# Patient Record
Sex: Female | Born: 1985 | Race: White | Hispanic: No | Marital: Married | Smoking: Never smoker
Health system: Southern US, Community
[De-identification: ages and names within clinical notes are randomized; demographics above are authoritative.]

## PROBLEM LIST (undated history)

## (undated) DIAGNOSIS — IMO0002 Reserved for concepts with insufficient information to code with codable children: Secondary | ICD-10-CM

## (undated) DIAGNOSIS — R12 Heartburn: Secondary | ICD-10-CM

## (undated) DIAGNOSIS — K819 Cholecystitis, unspecified: Secondary | ICD-10-CM

## (undated) DIAGNOSIS — O1490 Unspecified pre-eclampsia, unspecified trimester: Secondary | ICD-10-CM

## (undated) DIAGNOSIS — F419 Anxiety disorder, unspecified: Secondary | ICD-10-CM

## (undated) DIAGNOSIS — O26899 Other specified pregnancy related conditions, unspecified trimester: Secondary | ICD-10-CM

## (undated) HISTORY — PX: OTHER SURGICAL HISTORY: SHX169

## (undated) HISTORY — PX: FRACTURE SURGERY: SHX138

## (undated) HISTORY — PX: TENDON REPAIR: SHX5111

## (undated) HISTORY — DX: Unspecified pre-eclampsia, unspecified trimester: O14.90

## (undated) HISTORY — DX: Reserved for concepts with insufficient information to code with codable children: IMO0002

---

## 2006-01-22 ENCOUNTER — Other Ambulatory Visit: Admission: RE | Admit: 2006-01-22 | Discharge: 2006-01-22 | Payer: Self-pay | Admitting: Family Medicine

## 2007-03-05 DIAGNOSIS — IMO0002 Reserved for concepts with insufficient information to code with codable children: Secondary | ICD-10-CM

## 2007-03-05 HISTORY — DX: Reserved for concepts with insufficient information to code with codable children: IMO0002

## 2007-03-31 ENCOUNTER — Other Ambulatory Visit: Admission: RE | Admit: 2007-03-31 | Discharge: 2007-03-31 | Payer: Self-pay | Admitting: Family Medicine

## 2007-08-07 ENCOUNTER — Other Ambulatory Visit: Admission: RE | Admit: 2007-08-07 | Discharge: 2007-08-07 | Payer: Self-pay | Admitting: Obstetrics and Gynecology

## 2008-05-12 ENCOUNTER — Other Ambulatory Visit: Admission: RE | Admit: 2008-05-12 | Discharge: 2008-05-12 | Payer: Self-pay | Admitting: Obstetrics and Gynecology

## 2008-10-02 ENCOUNTER — Inpatient Hospital Stay (HOSPITAL_COMMUNITY): Admission: AC | Admit: 2008-10-02 | Discharge: 2008-10-05 | Payer: Self-pay

## 2008-10-04 ENCOUNTER — Ambulatory Visit: Payer: Self-pay | Admitting: Physical Medicine & Rehabilitation

## 2008-10-13 ENCOUNTER — Ambulatory Visit: Payer: Self-pay | Admitting: Physical Medicine & Rehabilitation

## 2008-11-09 ENCOUNTER — Ambulatory Visit: Payer: Self-pay | Admitting: Psychology

## 2009-01-30 ENCOUNTER — Encounter: Admission: RE | Admit: 2009-01-30 | Discharge: 2009-02-28 | Payer: Self-pay | Admitting: Orthopedic Surgery

## 2010-06-09 LAB — POCT I-STAT, CHEM 8
Calcium, Ion: 1.01 mmol/L — ABNORMAL LOW (ref 1.12–1.32)
Glucose, Bld: 107 mg/dL — ABNORMAL HIGH (ref 70–99)
HCT: 26 % — ABNORMAL LOW (ref 36.0–46.0)
Hemoglobin: 8.8 g/dL — ABNORMAL LOW (ref 12.0–15.0)
TCO2: 16 mmol/L (ref 0–100)

## 2010-06-09 LAB — CBC
HCT: 33.6 % — ABNORMAL LOW (ref 36.0–46.0)
Hemoglobin: 11.6 g/dL — ABNORMAL LOW (ref 12.0–15.0)
Hemoglobin: 9.3 g/dL — ABNORMAL LOW (ref 12.0–15.0)
MCHC: 34.5 g/dL (ref 30.0–36.0)
MCHC: 34.5 g/dL (ref 30.0–36.0)
MCV: 90.7 fL (ref 78.0–100.0)
MCV: 90.8 fL (ref 78.0–100.0)
Platelets: 218 10*3/uL (ref 150–400)
RBC: 2.8 MIL/uL — ABNORMAL LOW (ref 3.87–5.11)
RBC: 2.94 MIL/uL — ABNORMAL LOW (ref 3.87–5.11)
RBC: 2.98 MIL/uL — ABNORMAL LOW (ref 3.87–5.11)
RDW: 13.7 % (ref 11.5–15.5)
RDW: 13.8 % (ref 11.5–15.5)
RDW: 14.2 % (ref 11.5–15.5)
WBC: 7.6 10*3/uL (ref 4.0–10.5)

## 2010-06-09 LAB — TYPE AND SCREEN: ABO/RH(D): A POS

## 2010-06-09 LAB — POCT PREGNANCY, URINE: Preg Test, Ur: NEGATIVE

## 2010-06-09 LAB — ABO/RH: ABO/RH(D): A POS

## 2010-06-09 LAB — BASIC METABOLIC PANEL
CO2: 20 mEq/L (ref 19–32)
Chloride: 110 mEq/L (ref 96–112)
GFR calc non Af Amer: >60 mL/min (ref >60)
Glucose, Bld: 172 mg/dL — ABNORMAL HIGH (ref 70–99)
Potassium: 4.1 mEq/L (ref 3.5–5.1)
Sodium: 136 mEq/L (ref 135–145)

## 2010-06-09 LAB — POCT I-STAT 4, (NA,K, GLUC, HGB,HCT): HCT: 31 % — ABNORMAL LOW (ref 36.0–46.0)
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
## 2010-07-17 NOTE — Op Note (Signed)
NAME:  Amber Nguyen, NUON NO.:  [id number]   MEDICAL RECORD NO.:  [id number]          PATIENT TYPE:  INP   LOCATION:  3311                         FACILITY:  [facility]   PHYSICIAN:  Artist Pais. Weingold, M.D.DATE OF BIRTH:  November 03, 1985   DATE OF PROCEDURE:  10/02/2008  DATE OF DISCHARGE:                               OPERATIVE REPORT   PREOPERATIVE DIAGNOSIS:  Deep laceration dorsal aspect right hand with  significant forearm abrasions.   POSTOPERATIVE DIAGNOSIS:  Deep laceration dorsal aspect right hand with  significant forearm abrasions.   PROCEDURE:  Incision and drainage of the above, exploration repair of  extensor digiti quinti and extensor digitorum communis x2 right hand.   SURGEON:  Artist Pais. Mina Marble, MD   ASSISTANT:  None.   ANESTHESIA:  General.   TOURNIQUET TIME:  28 minutes.   COMPLICATIONS:  None.   DRAINS:  None.   OPERATIVE REPORT:  The patient was taken to the operating suite.  After  induction of adequate general anesthesia, the right upper extremity was  prepped draped in sterile fashion.  An Esmarch was used to exsanguinate  the limb and the tourniquet was inflated to 250 mmHg.  At this point in  time, large amount of abrasions over the ulnar border of the forearm  were irrigated and debrided of nonviable material.  The wound over the  ulnar border of the fifth digit just proximal to the metacarpophalangeal  joint and extended proximally, distally and longitudinally.  Dissection  was carried down to the extensor mechanism.  There was complete  laceration of the EDQ you and two fifths of the EDC to the fifth digit.  This wound was thoroughly irrigated, debrided of nonviable tissue and  then repairs were undertaken x3 using 3-0 Ethibond in horizontal  mattress sutures x2 for each tendon except for one small flip of the EDC  which had one horizontal mattress suture.  The wound was then again  irrigated, rough edges were debrided and was  loosely closed with 4-0  Vicryl Rapide suture.  Xeroform, 4x4s fluffs and a compressive dressing  and an ulnar gutter splint was applied as well as a large piece of  Xeroform for the proximal forearm abrasions.  The patient tolerated the  procedures well and went to recovery room in stable fashion.      Artist Pais Mina Marble, M.D.  Electronically Signed    MAW/MEDQ  D:  10/02/2008  T:  10/03/2008  Job:  [id number]
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
## 2010-08-07 ENCOUNTER — Emergency Department (HOSPITAL_BASED_OUTPATIENT_CLINIC_OR_DEPARTMENT_OTHER)
Admission: EM | Admit: 2010-08-07 | Discharge: 2010-08-07 | Disposition: A | Discharge status: Home / Self Care | Payer: BC Managed Care – PPO | Attending: Emergency Medicine | Admitting: Emergency Medicine

## 2010-08-07 DIAGNOSIS — H9209 Otalgia, unspecified ear: Secondary | ICD-10-CM | POA: Insufficient documentation

## 2012-07-09 ENCOUNTER — Encounter: Payer: Self-pay | Admitting: *Deleted

## 2012-07-10 ENCOUNTER — Ambulatory Visit (INDEPENDENT_AMBULATORY_CARE_PROVIDER_SITE_OTHER): Payer: BC Managed Care – PPO | Admitting: Certified Nurse Midwife

## 2012-07-10 ENCOUNTER — Encounter: Payer: Self-pay | Admitting: Certified Nurse Midwife

## 2012-07-10 VITALS — BP 110/72 | Ht 63.5 in | Wt 181.0 lb

## 2012-07-10 DIAGNOSIS — Z309 Encounter for contraceptive management, unspecified: Secondary | ICD-10-CM

## 2012-07-10 DIAGNOSIS — Z01419 Encounter for gynecological examination (general) (routine) without abnormal findings: Secondary | ICD-10-CM

## 2012-07-10 DIAGNOSIS — Z0000 Encounter for general adult medical examination without abnormal findings: Secondary | ICD-10-CM

## 2012-07-10 DIAGNOSIS — IMO0001 Reserved for inherently not codable concepts without codable children: Secondary | ICD-10-CM

## 2012-07-10 LAB — POCT URINALYSIS DIPSTICK
Bilirubin, UA: NEGATIVE
Ketones, UA: NEGATIVE
Leukocytes, UA: NEGATIVE
Nitrite, UA: NEGATIVE

## 2012-07-10 MED ORDER — ETONOGESTREL-ETHINYL ESTRADIOL 0.12-0.015 MG/24HR VA RING: VAGINAL_RING | VAGINAL | Status: DC

## 2012-07-10 NOTE — Patient Instructions (Addendum)
General topics  Next pap or exam is  due in 1 year Take a Women's multivitamin Take 1200 mg. of calcium daily - prefer dietary If any concerns in interim to call back  Breast Self-Awareness Practicing breast self-awareness may pick up problems early, prevent significant medical complications, and possibly save your life. By practicing breast self-awareness, you can become familiar with how your breasts look and feel and if your breasts are changing. This allows you to notice changes early. It can also offer you some reassurance that your breast health is good. One way to learn what is normal for your breasts and whether your breasts are changing is to do a breast self-exam. If you find a lump or something that was not present in the past, it is best to contact your caregiver right away. Other findings that should be evaluated by your caregiver include nipple discharge, especially if it is bloody; skin changes or reddening; areas where the skin seems to be pulled in (retracted); or new lumps and bumps. Breast pain is seldom associated with cancer (malignancy), but should also be evaluated by a caregiver. BREAST SELF-EXAM The best time to examine your breasts is 5 7 days after your menstrual period is over.  ExitCare Patient Information 2013 [employer].   Exercise to Stay Healthy Exercise helps you become and stay healthy. EXERCISE IDEAS AND TIPS Choose exercises that:  You enjoy.  Fit into your day. You do not need to exercise really hard to be healthy. You can do exercises at a slow or medium level and stay healthy. You can:  Stretch before and after working out.  Try yoga, Pilates, or tai chi.  Lift weights.  Walk fast, swim, jog, run, climb stairs, bicycle, dance, or rollerskate.  Take aerobic classes. Exercises that burn about 150 calories:  Running 1  miles in 15 minutes.  Playing volleyball for 45 to 60 minutes.  Washing and waxing a car for 45 to 60  minutes.  Playing touch football for 45 minutes.  Walking 1  miles in 35 minutes.  Pushing a stroller 1  miles in 30 minutes.  Playing basketball for 30 minutes.  Raking leaves for 30 minutes.  Bicycling 5 miles in 30 minutes.  Walking 2 miles in 30 minutes.  Dancing for 30 minutes.  Shoveling snow for 15 minutes.  Swimming laps for 20 minutes.  Walking up stairs for 15 minutes.  Bicycling 4 miles in 15 minutes.  Gardening for 30 to 45 minutes.  Jumping rope for 15 minutes.  Washing windows or floors for 45 to 60 minutes. Document Released: 03/23/2010 Document Revised: 05/13/2011 Document Reviewed: 03/23/2010 ExitCare Patient Information 2013 [employer].   Other topics ( that may be useful information):    Sexually Transmitted Disease Sexually transmitted disease (STD) refers to any infection that is passed from person to person during sexual activity. This may happen by way of saliva, semen, blood, vaginal mucus, or urine. Common STDs include:  Gonorrhea.  Chlamydia.  Syphilis.  HIV/AIDS.  Genital herpes.  Hepatitis B and C.  Trichomonas.  Human papillomavirus (HPV).  Pubic lice. CAUSES  An STD may be spread by bacteria, virus, or parasite. A person can get an STD by:  Sexual intercourse with an infected person.  Sharing sex toys with an infected person.  Sharing needles with an infected person.  Having intimate contact with the genitals, mouth, or rectal areas of an infected person. SYMPTOMS  Some people may not have any symptoms, but   they can still pass the infection to others. Different STDs have different symptoms. Symptoms include:  Painful or bloody urination.  Pain in the pelvis, abdomen, vagina, anus, throat, or eyes.  Skin rash, itching, irritation, growths, or sores (lesions). These usually occur in the genital or anal area.  Abnormal vaginal discharge.  Penile discharge in men.  Soft, flesh-colored skin growths in the  genital or anal area.  Fever.  Pain or bleeding during sexual intercourse.  Swollen glands in the groin area.  Yellow skin and eyes (jaundice). This is seen with hepatitis. DIAGNOSIS  To make a diagnosis, your caregiver may:  Take a medical history.  Perform a physical exam.  Take a specimen (culture) to be examined.  Examine a sample of discharge under a microscope.  Perform blood test TREATMENT   Chlamydia, gonorrhea, trichomonas, and syphilis can be cured with antibiotic medicine.  Genital herpes, hepatitis, and HIV can be treated, but not cured, with prescribed medicines. The medicines will lessen the symptoms.  Genital warts from HPV can be treated with medicine or by freezing, burning (electrocautery), or surgery. Warts may come back.  HPV is a virus and cannot be cured with medicine or surgery.However, abnormal areas may be followed very closely by your caregiver and may be removed from the cervix, vagina, or vulva through office procedures or surgery. If your diagnosis is confirmed, your recent sexual partners need treatment. This is true even if they are symptom-free or have a negative culture or evaluation. They should not have sex until their caregiver says it is okay. HOME CARE INSTRUCTIONS  All sexual partners should be informed, tested, and treated for all STDs.  Take your antibiotics as directed. Finish them even if you start to feel better.  Only take over-the-counter or prescription medicines for pain, discomfort, or fever as directed by your caregiver.  Rest.  Eat a balanced diet and drink enough fluids to keep your urine clear or pale yellow.  Do not have sex until treatment is completed and you have followed up with your caregiver. STDs should be checked after treatment.  Keep all follow-up appointments, Pap tests, and blood tests as directed by your caregiver.  Only use latex condoms and water-soluble lubricants during sexual activity. Do not use  petroleum jelly or oils.  Avoid alcohol and illegal drugs.  Get vaccinated for HPV and hepatitis. If you have not received these vaccines in the past, talk to your caregiver about whether one or both might be right for you.  Avoid risky sex practices that can break the skin. The only way to avoid getting an STD is to avoid all sexual activity.Latex condoms and dental dams (for oral sex) will help lessen the risk of getting an STD, but will not completely eliminate the risk. SEEK MEDICAL CARE IF:   You have a fever.  You have any new or worsening symptoms. Document Released: 05/11/2002 Document Revised: 05/13/2011 Document Reviewed: 05/18/2010 ExitCare Patient Information 2013 [employer].    Domestic Abuse You are being battered or abused if someone close to you hits, pushes, or physically hurts you in any way. You also are being abused if you are forced into activities. You are being sexually abused if you are forced to have sexual contact of any kind. You are being emotionally abused if you are made to feel worthless or if you are constantly threatened. It is important to remember that help is available. No one has the right to abuse you. PREVENTION OF FURTHER   ABUSE  Learn the warning signs of danger. This varies with situations but may include: the use of alcohol, threats, isolation from friends and family, or forced sexual contact. Leave if you feel that violence is going to occur.  If you are attacked or beaten, report it to the police so the abuse is documented. You do not have to press charges. The police can protect you while you or the attackers are leaving. Get the officer's name and badge number and a copy of the report.  Find someone you can trust and tell them what is happening to you: your caregiver, a nurse, clergy member, close friend or family member. Feeling ashamed is natural, but remember that you have done nothing wrong. No one deserves abuse. Document Released:  02/16/2000 Document Revised: 05/13/2011 Document Reviewed: 04/26/2010 ExitCare Patient Information 2013 [employer].    How Much is Too Much Alcohol? Drinking too much alcohol can cause injury, accidents, and health problems. These types of problems can include:   Car crashes.  Falls.  Family fighting (domestic violence).  Drowning.  Fights.  Injuries.  Burns.  Damage to certain organs.  Having a baby with birth defects. ONE DRINK CAN BE TOO MUCH WHEN YOU ARE:  Working.  Pregnant or breastfeeding.  Taking medicines. Ask your doctor.  Driving or planning to drive. If you or someone you know has a drinking problem, get help from a doctor.  Document Released: 12/15/2008 Document Revised: 05/13/2011 Document Reviewed: 12/15/2008 ExitCare Patient Information 2013 [employer].   Smoking Hazards Smoking cigarettes is extremely bad for your health. Tobacco smoke has over 200 known poisons in it. There are over 60 chemicals in tobacco smoke that cause cancer. Some of the chemicals found in cigarette smoke include:   Cyanide.  Benzene.  Formaldehyde.  Methanol (wood alcohol).  Acetylene (fuel used in welding torches).  Ammonia. Cigarette smoke also contains the poisonous gases nitrogen oxide and carbon monoxide.  Cigarette smokers have an increased risk of many serious medical problems and Smoking causes approximately:  90% of all lung cancer deaths in men.  80% of all lung cancer deaths in women.  90% of deaths from chronic obstructive lung disease. Compared with nonsmokers, smoking increases the risk of:  Coronary heart disease by 2 to 4 times.  Stroke by 2 to 4 times.  Men developing lung cancer by 23 times.  Women developing lung cancer by 13 times.  Dying from chronic obstructive lung diseases by 12 times.  . Smoking is the most preventable cause of death and disease in our society.  WHY IS SMOKING ADDICTIVE?  Nicotine is the chemical  agent in tobacco that is capable of causing addiction or dependence.  When you smoke and inhale, nicotine is absorbed rapidly into the bloodstream through your lungs. Nicotine absorbed through the lungs is capable of creating a powerful addiction. Both inhaled and non-inhaled nicotine may be addictive.  Addiction studies of cigarettes and spit tobacco show that addiction to nicotine occurs mainly during the teen years, when young people begin using tobacco products. WHAT ARE THE BENEFITS OF QUITTING?  There are many health benefits to quitting smoking.   Likelihood of developing cancer and heart disease decreases. Health improvements are seen almost immediately.  Blood pressure, pulse rate, and breathing patterns start returning to normal soon after quitting. QUITTING SMOKING   American Lung Association - 1-800-LUNGUSA  American Cancer Society - 1-800-ACS-2345 Document Released: 03/28/2004 Document Revised: 05/13/2011 Document Reviewed: 11/30/2008 ExitCare Patient Information 2013 [employer].   Stress Management Stress is a state of physical or mental tension that often results from changes in your life or normal routine. Some common causes of stress are:  Death of a loved one.  Injuries or severe illnesses.  Getting fired or changing jobs.  Moving into a new home. Other causes may be:  Sexual problems.  Business or financial losses.  Taking on a large debt.  Regular conflict with someone at home or at work.  Constant tiredness from lack of sleep. It is not just bad things that are stressful. It may be stressful to:  Win the lottery.  Get married.  Buy a new car. The amount of stress that can be easily tolerated varies from person to person. Changes generally cause stress, regardless of the types of change. Too much stress can affect your health. It may lead to physical or emotional problems. Too little stress (boredom) may also become stressful. SUGGESTIONS TO  REDUCE STRESS:  Talk things over with your family and friends. It often is helpful to share your concerns and worries. If you feel your problem is serious, you may want to get help from a professional counselor.  Consider your problems one at a time instead of lumping them all together. Trying to take care of everything at once may seem impossible. List all the things you need to do and then start with the most important one. Set a goal to accomplish 2 or 3 things each day. If you expect to do too many in a single day you will naturally fail, causing you to feel even more stressed.  Do not use alcohol or drugs to relieve stress. Although you may feel better for a short time, they do not remove the problems that caused the stress. They can also be habit forming.  Exercise regularly - at least 3 times per week. Physical exercise can help to relieve that "uptight" feeling and will relax you.  The shortest distance between despair and hope is often a good night's sleep.  Go to bed and get up on time allowing yourself time for appointments without being rushed.  Take a short "time-out" period from any stressful situation that occurs during the day. Close your eyes and take some deep breaths. Starting with the muscles in your face, tense them, hold it for a few seconds, then relax. Repeat this with the muscles in your neck, shoulders, hand, stomach, back and legs.  Take good care of yourself. Eat a balanced diet and get plenty of rest.  Schedule time for having fun. Take a break from your daily routine to relax. HOME CARE INSTRUCTIONS   Call if you feel overwhelmed by your problems and feel you can no longer manage them on your own.  Return immediately if you feel like hurting yourself or someone else. Document Released: 08/14/2000 Document Revised: 05/13/2011 Document Reviewed: 04/06/2007 ExitCare Patient Information 2013 [employer].   

## 2012-07-10 NOTE — Progress Notes (Signed)
27 y.o. G0P0000 Married Caucasian Fe here for annual exam. Periods normal, no issues.  Nuvaring working well. Recently married!! No health issues today.   Patient's last menstrual period was 06/24/2012.          Sexually active: yes  The current method of family planning is NuvaRing vaginal inserts.    Exercising: yes  walking,cardio Smoker:  no  Health Maintenance: Pap:  07-04-11 neg MMG:  none Colonoscopy:  none BMD:   2002 TDaP:  2013 Labs: poct urine-neg, hgb-12.3   reports that she has never smoked. She does not have any smokeless tobacco history on file. She reports that she drinks about 0.5 ounces of alcohol per week. She reports that she does not use illicit drugs.  Past Medical History  Diagnosis Date  . 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 Tubes in ears      Current Outpatient Prescriptions  Medication Sig Dispense Refill  . etonogestrel-ethinyl estradiol (NUVARING) 0.12-0.015 MG/24HR vaginal ring Place 1 each vaginally every 28 (twenty-eight) days. Insert vaginally and leave in place for 3 consecutive weeks, then remove for 1 week.      . Multiple Vitamin (MULTIVITAMIN) capsule Take 1 capsule by mouth daily.       No current facility-administered medications for this visit.    Family History  Problem Relation Age of Onset  . Thyroid disease Mother   . Hypertension Father   . Diabetes Paternal Grandmother   . Diabetes Paternal Grandfather   . Cancer Maternal Grandmother     lung    ROS:  Pertinent items are noted in HPI.  Otherwise, a comprehensive ROS was negative.  Exam:   BP 110/72  Ht 5' 3.5" (1.613 m)  Wt 181 lb (82.101 kg)  BMI 31.56 kg/m2  LMP 06/24/2012 Height: 5' 3.5" (161.3 cm)  Ht Readings from Last 3 Encounters:  07/10/12 5' 3.5" (1.613 m)    General appearance: alert, cooperative and appears stated age Head: Normocephalic, without obvious abnormality, atraumatic Neck: no adenopathy, supple,  symmetrical, trachea midline and thyroid normal to inspection and palpation Lungs: clear to auscultation bilaterally Breasts: normal appearance, no masses or tenderness, No nipple discharge or bleeding, No axillary or supraclavicular adenopathy, Taught monthly breast self examination Heart: regular rate and rhythm Abdomen: soft, non-tender; no masses,  no organomegaly Extremities: extremities normal, atraumatic, no cyanosis or edema Skin: Skin color, texture, turgor normal. No rashes or lesions Lymph nodes: Cervical, supraclavicular, and axillary nodes normal. No abnormal inguinal nodes palpated Neurologic: Grossly normal   Pelvic: External genitalia:  no lesions              Urethra:  normal appearing urethra with no masses, tenderness or lesions              Bartholin's and Skene's: normal                 Vagina: normal appearing vagina with normal color and discharge, no lesions              Cervix: normal non tender              Pap taken: no Bimanual Exam:  Uterus:  normal size, contour, position, consistency, mobility, non-tender and anteflexed              Adnexa: normal adnexa and no mass, fullness, tenderness  Rectovaginal: Confirms               Anus:  normal sphincter tone, no lesions  A:  Well Woman with normal exam  Contraception Nuvaring  P:   Reviewed health and wellness pertinent to exam  Pap smear as per guidelines No pap smear today counseled on adequate intake of calcium and vitamin D, diet and exercise  Rx Nuvaring see order return annually or prn  An After Visit Summary was printed and given to the patient.  Reviewed, TL

## 2012-07-13 LAB — HEMOGLOBIN, FINGERSTICK: Hemoglobin, fingerstick: 12.3 g/dL (ref 12.0–16.0)

## 2012-12-21 ENCOUNTER — Telehealth: Payer: Self-pay | Admitting: Certified Nurse Midwife

## 2012-12-21 NOTE — Telephone Encounter (Signed)
Spoke with pt about appt. Pt needs early am or late pm appt as she works in [place]. Sched OV tomorrow at 4 pm with DL.

## 2012-12-21 NOTE — Telephone Encounter (Signed)
Patient wants to come in and talk with debbie about preconceptional counciling. Couldn't get her in until november 24 and she doesn't want to wait that long.

## 2012-12-22 ENCOUNTER — Ambulatory Visit (INDEPENDENT_AMBULATORY_CARE_PROVIDER_SITE_OTHER): Payer: BC Managed Care – PPO | Admitting: Certified Nurse Midwife

## 2012-12-22 ENCOUNTER — Encounter: Payer: Self-pay | Admitting: Certified Nurse Midwife

## 2012-12-22 VITALS — BP 110/80 | HR 64 | Resp 16 | Ht 63.5 in | Wt 187.0 lb

## 2012-12-22 DIAGNOSIS — Z3009 Encounter for other general counseling and advice on contraception: Secondary | ICD-10-CM

## 2012-12-22 NOTE — Progress Notes (Signed)
27 y.o. Married Caucasian female G0P0000 here for preconceptional counseling. Patient would like information also on genetic counseling, due to sister child having Auto Recessive polycystic kidney disease. Patient denies any history of genetic problems for her or spouse. Patient is currently being monitored for one occurrence elevated glucose level with PCP, no medications. Currently on Nuvaring for contraception. Both patient and spouse has regular aex without problems with lab work. Patient on balanced diet for weight loss. Patient walks 3 miles daily and stretches. No other health issues. Financially ready for pregnancy, no environmental hazards with work or home.  Immunizations up to date. Has not taken flu vaccine. Paient and spouse non smokers, no alcohol or drug use. Patient taking multivitamin only.  O: Healthy WD,WN female Affect: Normal  A:Preconceptual consultation, planning pregnancy in the beginning of 2015. 2-Rubella status 3-Family history(sister child) RPKD   P: Discussed importance of nutrition and normal weight for height prior to pregnancy to reduce risk of excessive weight gain and PIH. Patient currently working on weight loss, encouraged to continue. Discussed possible genetic phone consultation with [facility] to see status if screening needed. Patient will call and advise if referral needed, if decides to do genetic assessment. Given handout on preparation before conception, folic acid sources and ovulation chart. Instructed to stop OCP 2 months prior to trying for conception, can use condoms for contraception. Lab: Rubella immune status. Questions addressed at length. Will advise when planning to start trying.     RV prn

## 2012-12-23 NOTE — Progress Notes (Signed)
Note reviewed, agree with plan.  Duncan Alejandro, MD  

## 2013-02-23 ENCOUNTER — Telehealth: Payer: Self-pay | Admitting: Certified Nurse Midwife

## 2013-02-23 NOTE — Telephone Encounter (Signed)
Patient has a positive at home pregnancy test wants to come in to take on here.

## 2013-02-23 NOTE — Telephone Encounter (Signed)
Spoke with patient. States LMP 01/21/13. Positive home pregnancy test at home. Appointment scheduled. Husband will come with her so scheduled for 12/31.  Routing to provider for final review. Patient agreeable to disposition. Will close encounter

## 2013-03-03 ENCOUNTER — Ambulatory Visit (INDEPENDENT_AMBULATORY_CARE_PROVIDER_SITE_OTHER): Payer: BC Managed Care – PPO | Admitting: Certified Nurse Midwife

## 2013-03-03 ENCOUNTER — Encounter: Payer: Self-pay | Admitting: Certified Nurse Midwife

## 2013-03-03 VITALS — BP 110/70 | HR 64 | Resp 16 | Ht 63.5 in | Wt 191.0 lb

## 2013-03-03 DIAGNOSIS — Z3201 Encounter for pregnancy test, result positive: Secondary | ICD-10-CM

## 2013-03-03 DIAGNOSIS — N912 Amenorrhea, unspecified: Secondary | ICD-10-CM

## 2013-03-03 LAB — POCT URINE PREGNANCY: Preg Test, Ur: POSITIVE

## 2013-03-03 NOTE — Progress Notes (Signed)
Encounter reviewed by Dr. Brook Silva.  

## 2013-03-03 NOTE — Progress Notes (Signed)
  27 y.o.Married Caucasian female go po presents with no menses since11/20/14 and positive UPT at home and confirmed here today. Periods were regular, no problems with. Patient had stopped Nuvaring to anticipate pregnancy. Patient currently taking prenatal vitamins daily and no other medications. Denies alcohol, tobacco or drug use.. Patient denies andy bleeding or cramping. Complaining of slight nausea on occasion and breast tenderness. Spouse with patient. Patient is excited about pregnancy. Eating 3 meals a day with protein shake in am. Fluid intake mainly water, with some juice, occasional soda. Walks for exercise. Denies environmental hazards at work or at home. No other health issues. Patient sees PCP for aex and labs.  O: Healthy WDWN female Affect:Normal, orientation x 3 Rubella status immune on 12/22/12 AEX 07/10/12 normal  POCT UPT positive   Assessment:  Amenorrhea with positive UPT Family History of RPKD(sister child)  Plan: Discussed with patient importance of prenatal care, given list of current OB providers in area. Discussed birth options and questions addressed. Discussed nutritional needs with handout given. Discussed importance of adequate fluid intake and rest to avoid fatigue. Comfort measures for nausea given. Reviewed warning signs of early pregnancy and need to advise. Patient will call and schedule OB appointment and advise to practice so records can be sent. Questions addressed regarding care. Wished well with pregnancy.   RV prn    42 minutes spent with patient with >50% of time spent in face to face counseling.

## 2013-03-10 LAB — OB RESULTS CONSOLE RUBELLA ANTIBODY, IGM: Rubella: IMMUNE

## 2013-03-10 LAB — OB RESULTS CONSOLE ABO/RH: RH Type: POSITIVE

## 2013-03-10 LAB — OB RESULTS CONSOLE HIV ANTIBODY (ROUTINE TESTING): HIV: NONREACTIVE

## 2013-03-10 LAB — OB RESULTS CONSOLE RPR: RPR: NONREACTIVE

## 2013-03-10 LAB — OB RESULTS CONSOLE GC/CHLAMYDIA
CHLAMYDIA, DNA PROBE: NEGATIVE
Gonorrhea: NEGATIVE

## 2013-03-10 LAB — OB RESULTS CONSOLE ANTIBODY SCREEN: Antibody Screen: NEGATIVE

## 2013-03-10 LAB — OB RESULTS CONSOLE HEPATITIS B SURFACE ANTIGEN: HEP B S AG: NEGATIVE

## 2013-04-07 ENCOUNTER — Encounter (HOSPITAL_COMMUNITY): Payer: BC Managed Care – PPO

## 2013-04-09 ENCOUNTER — Ambulatory Visit (HOSPITAL_COMMUNITY)
Admission: RE | Admit: 2013-04-09 | Discharge: 2013-04-09 | Disposition: A | Discharge status: Home / Self Care | Payer: BC Managed Care – PPO | Source: Ambulatory Visit | Attending: Obstetrics and Gynecology | Admitting: Obstetrics and Gynecology

## 2013-04-09 DIAGNOSIS — O352XX0 Maternal care for (suspected) hereditary disease in fetus, not applicable or unspecified: Secondary | ICD-10-CM

## 2013-04-09 NOTE — Progress Notes (Signed)
Genetic Counseling  High-Risk Gestation Note  Appointment Date:  04/09/2013 Referred By: Amber Ou, MD Date of Birth:  [birth year] Partner:  Amber Nguyen   Pregnancy History: G1P0000 Estimated Date of Delivery: 11/23/13 Estimated Gestational Age: 15w1dAttending: MRenella Cunas MD  I met with Mrs. Amber Bodoand her husband, [name] for genetic counseling regarding family history concerns.  Amber Nguyen reported that her nephew has a diagnosis of autosomal recessive polycystic kidney disease (ARPKD).  By report, the pregnancy history was remarkable for oligohydramnios.  Amber Nguyen attended several of her sister's doctor's appointments and reported that her sister's anatomy ultrasound was wnl.  At birth, her nephew had abnormal urine output and labs.  His kidneys were enlarged by physical exam and a renal ultrasound confirmed this suspicion.  [facility]  By report, [facility]  He was given a clinical diagnosis of ARPKD.  Amber Nguyen reported that genetic testing was performed, but the results were "inconclusive."  Based on further questioning, it appears that the child had one detectable gene alteration, but not a second.  Parental testing has not been performed at this time.  Medical records were not available for review.  Amber Nguyen contacted her sister during our appointment.  Her sister agreed to send me her son's genetics records, including his genetic test results for confirmation of the reported history.    This couple was counseled that ARPKD is a genetic condition, which occurs in ~1 in every 10,000 to 40,000 individuals.  The condition is characterized by both renal and liver disease, but other organ systems can be variably affected.  The majority of individuals with ARPKD classically present during the fetal or neonatal period with enlarged echogenic kidneys.  Renal disease is characterized by  nephromegaly, hypertension, and renal dysfunction.  Greater than 50% of individuals with ARPKD have end stage renal disease (ESRD) within the first decade of life.  ESRD often requires kidney transplantation.  We discussed that fetal onset of the condition is associated with kidney dysfunction in utero, which causes oligohydramnios and pulmonary hypoplasia.  These infants have significant risk for early death secondary to respiratory distress.  In addition, we discussed that ARPKD causes hepatobiliary disease, which is characterized by hepatomegaly, splenomegaly, and progressive portal hypertension.  We discussed that ARPKD shows significant variability in the age of onset and the presenting clinical features.  We spent time reviewing the autosomal recessive inheritance of ARPKD.  We discussed that PKHD1 is the only gene known to be associated with ARPKD.  They were counseled that parents of a child with ARPKD are typically carriers of the condition.  A carrier refers to an individual who has one altered gene and one typical functioning copy of the gene.  Carriers of ARPKD are healthy and have no features of ARPKD.  Considering the reported family history, Ms. TMckimmyhas a 1 in 2 chance of being a carrier of ARPKD.  Given that Amber Nguyen's sister has not had genetic testing, and it is uncertain if her nephew has 1 or 2 detectable PKHD1 alterations, genetic testing for Amber Nguyen would not be fully informative at this time.  We reviewed that the general population carrier frequency of ARPKD is ~1 in 749  We discussed that given the family history, the population frequency of ARPKD, and the recessive inheritance, the risk for the fetus to have ARPKD is ~1 in 529  We discussed the availability of prenatal diagnosis for ARPKD.  They understand that once familial gene mutations are known, prenatal diagnosis can be performed via CVS or amniocentesis.  Given that ARPKD can present during fetal development, we also  discussed the availability of a detailed ultrasound at 18+ weeks gestation to assess fetal kidney development and function.  A repeat ultrasound at ~28+ weeks should be considered as well.  This couple expressed interest in ultrasound.  Amber Nguyen was scheduled to return at [redacted] weeks gestation for a detailed anatomy ultrasound.  Amber Nguyen may elect to have genetic testing to determine her carrier status for ARPKD, if her sister has genetic testing and is found to have a detectable PKHD1 alteration.    This couple was counseled that there are a variety of genetic conditions with cystic renal disease, including autosomal dominant PKD (ADPKD).  We discussed that ADPKD typically has adult onset; however, 1-2% of patients present during the neonatal period, often with signs and symptoms indistinguishable from ARPKD.  For this reason, we discussed that the risk assessment and options discussed today depend on the accuracy of the reported history.  Further genetic counseling is warranted if additional information is discovered.  The remainder of the family histories were reviewed and found to be contributory for Amber Nguyen having a brother who had a nephrectomy at age 10.  Amber Nguyen is not sure why his brother had his kidney removed.  We discussed the various causes for a nephrectomy.  There is no other family history of renal disease.  Without further information an accurate risk assessment cannot be provided.  We discussed the option of Amber Nguyen having renal imaging to assess his own kidneys.    Amber Nguyen was provided with written information regarding cystic fibrosis (CF) including the carrier frequency and incidence in the Caucasian population, the availability of carrier testing and prenatal diagnosis if indicated.  In addition, we discussed that CF is routinely screened for as part of the [place] newborn screening panel.  She declined testing today.   Amber Nguyen denied exposure to environmental toxins or  chemical agents. She denied the use of alcohol, tobacco or street drugs. She denied significant viral illnesses during the course of her pregnancy.   I counseled this couple regarding the above risks and available options.  The approximate face-to-face time with the genetic counselor was 53 minutes.  Sharyne Richters, MS Certified Genetic Counselor

## 2013-06-04 ENCOUNTER — Other Ambulatory Visit: Payer: Self-pay | Admitting: Obstetrics and Gynecology

## 2013-06-04 DIAGNOSIS — Z8279 Family history of other congenital malformations, deformations and chromosomal abnormalities: Secondary | ICD-10-CM

## 2013-06-04 DIAGNOSIS — Z3689 Encounter for other specified antenatal screening: Secondary | ICD-10-CM

## 2013-06-09 ENCOUNTER — Encounter (HOSPITAL_COMMUNITY): Payer: Self-pay

## 2013-06-09 ENCOUNTER — Ambulatory Visit (HOSPITAL_COMMUNITY)
Admission: RE | Admit: 2013-06-09 | Discharge: 2013-06-09 | Disposition: A | Discharge status: Home / Self Care | Payer: BC Managed Care – PPO | Source: Ambulatory Visit | Attending: Obstetrics and Gynecology | Admitting: Obstetrics and Gynecology

## 2013-06-09 VITALS — BP 126/82 | HR 112 | Wt 192.0 lb

## 2013-06-09 DIAGNOSIS — Z1389 Encounter for screening for other disorder: Secondary | ICD-10-CM | POA: Insufficient documentation

## 2013-06-09 DIAGNOSIS — Z363 Encounter for antenatal screening for malformations: Secondary | ICD-10-CM | POA: Insufficient documentation

## 2013-06-09 DIAGNOSIS — Z8279 Family history of other congenital malformations, deformations and chromosomal abnormalities: Secondary | ICD-10-CM

## 2013-06-09 DIAGNOSIS — O358XX0 Maternal care for other (suspected) fetal abnormality and damage, not applicable or unspecified: Secondary | ICD-10-CM

## 2013-06-09 DIAGNOSIS — O352XX0 Maternal care for (suspected) hereditary disease in fetus, not applicable or unspecified: Secondary | ICD-10-CM

## 2013-06-09 DIAGNOSIS — Z3689 Encounter for other specified antenatal screening: Secondary | ICD-10-CM

## 2013-07-07 ENCOUNTER — Ambulatory Visit (HOSPITAL_COMMUNITY)
Admission: RE | Admit: 2013-07-07 | Discharge: 2013-07-07 | Disposition: A | Discharge status: Home / Self Care | Payer: BC Managed Care – PPO | Source: Ambulatory Visit | Attending: Obstetrics and Gynecology | Admitting: Obstetrics and Gynecology

## 2013-07-07 ENCOUNTER — Encounter (HOSPITAL_COMMUNITY): Payer: Self-pay

## 2013-07-07 DIAGNOSIS — O358XX0 Maternal care for other (suspected) fetal abnormality and damage, not applicable or unspecified: Secondary | ICD-10-CM | POA: Insufficient documentation

## 2013-07-07 DIAGNOSIS — O352XX0 Maternal care for (suspected) hereditary disease in fetus, not applicable or unspecified: Secondary | ICD-10-CM | POA: Insufficient documentation

## 2013-07-07 DIAGNOSIS — Z3689 Encounter for other specified antenatal screening: Secondary | ICD-10-CM | POA: Insufficient documentation

## 2013-08-09 ENCOUNTER — Ambulatory Visit: Payer: BC Managed Care – PPO | Admitting: Certified Nurse Midwife

## 2013-09-08 ENCOUNTER — Other Ambulatory Visit: Payer: Self-pay

## 2013-09-17 ENCOUNTER — Ambulatory Visit (HOSPITAL_COMMUNITY)
Admission: RE | Admit: 2013-09-17 | Discharge: 2013-09-17 | Disposition: A | Discharge status: Home / Self Care | Payer: BC Managed Care – PPO | Source: Ambulatory Visit | Attending: Obstetrics and Gynecology | Admitting: Obstetrics and Gynecology

## 2013-09-17 DIAGNOSIS — L299 Pruritus, unspecified: Secondary | ICD-10-CM | POA: Insufficient documentation

## 2013-09-17 DIAGNOSIS — O26619 Liver and biliary tract disorders in pregnancy, unspecified trimester: Secondary | ICD-10-CM | POA: Insufficient documentation

## 2013-09-17 DIAGNOSIS — K838 Other specified diseases of biliary tract: Secondary | ICD-10-CM | POA: Insufficient documentation

## 2013-09-17 DIAGNOSIS — O9989 Other specified diseases and conditions complicating pregnancy, childbirth and the puerperium: Secondary | ICD-10-CM | POA: Insufficient documentation

## 2013-09-17 NOTE — Consult Note (Signed)
MFM Note  Amber Nguyen is a 28 year old G1 Caucasian female at 33+2 weeks who presented for consultation regarding persistent pruritis. She reports that the itching started about 2-3 weeks ago. It is worse at night and located on legs, back and arms. No rash at any time. Vistaril was prescribed but was not very effective. LFTs and bile acids were obtained. The LFTs were within normal limits (alk phos is elevated in pregnancy. The bile acids returned with a value of 11 umol/L which is within normal limits for nonpregnant individual; however, a value > 10 is considered elevated in pregnancy.   Med/surg hx: none Allergies: NKDAs Social: teacher Family hx is significant for ARPKD in the patient's sisters son; she has had extensive counseling with, Amber Nguyen, using the limited information provided, their fetal risk is ~ 1 in 560; the kidneys were normal at the anatomic survey US  Assessment: 1) SIUP at 33+2 weeks 2) Cholestasis of pregnancy 3) NST reactive today  Recommendations: 1) Begin twice weekly NSTs with weekly AFIs 2) Growth US if it has been > 4 weeks from the prior US 2) Start ursodiol (600 mgs twice daily) 3) Deliver at 37 weeks  Thank you for the kind referral.  (Face-to-face consultation with patient: 45 min) 

## 2013-10-04 ENCOUNTER — Encounter (HOSPITAL_COMMUNITY): Payer: Self-pay | Admitting: Pharmacist

## 2013-10-07 ENCOUNTER — Encounter (HOSPITAL_COMMUNITY): Payer: Self-pay | Admitting: *Deleted

## 2013-10-07 ENCOUNTER — Inpatient Hospital Stay (HOSPITAL_COMMUNITY)
Admission: AD | Admit: 2013-10-07 | Discharge: 2013-10-07 | Disposition: A | Discharge status: Home / Self Care | Payer: BC Managed Care – PPO | Source: Ambulatory Visit | Attending: Obstetrics & Gynecology | Admitting: Obstetrics & Gynecology

## 2013-10-07 DIAGNOSIS — Z833 Family history of diabetes mellitus: Secondary | ICD-10-CM | POA: Insufficient documentation

## 2013-10-07 DIAGNOSIS — O26619 Liver and biliary tract disorders in pregnancy, unspecified trimester: Secondary | ICD-10-CM | POA: Insufficient documentation

## 2013-10-07 DIAGNOSIS — Z801 Family history of malignant neoplasm of trachea, bronchus and lung: Secondary | ICD-10-CM | POA: Insufficient documentation

## 2013-10-07 DIAGNOSIS — I159 Secondary hypertension, unspecified: Secondary | ICD-10-CM

## 2013-10-07 DIAGNOSIS — O10019 Pre-existing essential hypertension complicating pregnancy, unspecified trimester: Secondary | ICD-10-CM | POA: Insufficient documentation

## 2013-10-07 DIAGNOSIS — K838 Other specified diseases of biliary tract: Secondary | ICD-10-CM | POA: Insufficient documentation

## 2013-10-07 DIAGNOSIS — Z8249 Family history of ischemic heart disease and other diseases of the circulatory system: Secondary | ICD-10-CM | POA: Insufficient documentation

## 2013-10-07 DIAGNOSIS — O4700 False labor before 37 completed weeks of gestation, unspecified trimester: Secondary | ICD-10-CM | POA: Insufficient documentation

## 2013-10-07 DIAGNOSIS — O328XX0 Maternal care for other malpresentation of fetus, not applicable or unspecified: Secondary | ICD-10-CM | POA: Insufficient documentation

## 2013-10-07 HISTORY — DX: Cholecystitis, unspecified: K81.9

## 2013-10-07 MED ORDER — LABETALOL HCL 100 MG PO TABS
100.0000 mg | ORAL_TABLET | Freq: Two times a day (BID) | ORAL | Status: DC
  Administered 2013-10-07: 100 mg via ORAL
  Filled 2013-10-07: qty 1

## 2013-10-07 MED ORDER — LABETALOL HCL 100 MG PO TABS: 100.0000 mg | ORAL_TABLET | Freq: Every day | ORAL | Status: DC

## 2013-10-07 MED ORDER — LACTATED RINGERS IV BOLUS (SEPSIS)
500.0000 mL | Freq: Once | INTRAVENOUS | Status: AC
  Administered 2013-10-07: 500 mL via INTRAVENOUS

## 2013-10-07 MED ORDER — LACTATED RINGERS IV SOLN
INTRAVENOUS | Status: DC
  Administered 2013-10-07: 16:00:00 via INTRAVENOUS

## 2013-10-07 NOTE — MAU Note (Signed)
Patient states she was seen in the office this am for regular visit and U/S. States her blood pressure was elevated and she was sent to MAU for further evaluation. Patient denies S/S of elevated blood pressure. No contractions, leaking or bleeding. Reports good fetal movement.

## 2013-10-07 NOTE — MAU Note (Signed)
Urine in Lab 

## 2013-10-07 NOTE — Progress Notes (Signed)
V. Standard CNM called to inform RN of plan of care; labs and ultrasound results pending from office. Continue to monitor bp's and FHR. Will call back to further update when results return.

## 2013-10-07 NOTE — MAU Provider Note (Signed)
Amber Nguyen is a 28 y.o. G1P0 at 36.1 weeks, presents to MAU from the office for HTN.  BPs 160/100, 120/90, 140/100. She denies pain, HA, URQ pain, n/v, ctx, vb or lof w/+FM.  NST in the office was reactive but not reassuring, BPP was 8/8, AFI 12.5 and footling breech.  She has a schedule CS on 10/19/13    History     Patient Active Problem List   Diagnosis Date Noted  . Hereditary disease in family possibly affecting fetus, affecting management of mother, antepartum condition or complication 04/09/2013    Chief Complaint  Patient presents with  . Hypertension   HPI  OB History   Grav Para Term Preterm Abortions TAB SAB Ect Mult Living   1 0 0 0 0 0 0 0 0 0       Past Medical History  Diagnosis Date  . 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 Cholecystitis     Past Surgical History  Procedure Laterality Date  . Tubes in ears      Family History  Problem Relation Age of Onset  . Thyroid disease Mother   . Hypertension Father   . Diabetes Paternal Grandmother   . Diabetes Paternal Grandfather   . Cancer Maternal Grandmother     lung    History  Substance Use Topics  . Smoking status: Never Smoker   . Smokeless tobacco: Not on file  . Alcohol Use: No    Allergies: No Known Allergies  Prescriptions prior to admission  Medication Sig Dispense Refill  . Prenatal Vit-Fe Sulfate-FA (PRENATAL VITAMIN PO) Take by mouth daily.      . ursodiol (ACTIGALL) 300 MG capsule Take 600 mg by mouth 2 (two) times daily.        ROS See HPI above, all other systems are negative  Physical Exam   Blood pressure 152/93, pulse 72, temperature 99 F (37.2 C), temperature source Oral, resp. rate 16, height 5\' 4"  (1.626 m), weight 223 lb 12.8 oz (101.515 kg), last menstrual period 01/21/2013, SpO2 98.00%.  Filed Vitals:   10/07/13 1337 10/07/13 1346 10/07/13 1400 10/07/13 1416  BP: 156/78 151/86 138/91 152/93  Pulse: 68 71 71 72  Temp:      TempSrc:      Resp:      Height:       Weight:      SpO2:       Physical Exam  Ext:  WNL ABD: Soft, non tender to palpation, no rebound or guarding SVE: deferred   ED Course  Assessment: IUP at  36.1weeks Membranes: intact FHR: Category 1 CTX:  occassional   Plan: NST Serial BC Labs pending from [facility]   Rae Plotner, CNM, MSN 10/07/2013. 2:24 PM

## 2013-10-07 NOTE — MAU Provider Note (Signed)
   Consulted with Dr Sallye OberKulwa Labs pending FHR category 1 Ctx 3-5 SVE C/T/H Denies ctx, pain, vb or lof w/+FM   Give IV Bolus

## 2013-10-07 NOTE — Discharge Instructions (Signed)
Fetal Movement Counts °Patient Name: __________________________________________________ Patient Due Date: ____________________ °Performing a fetal movement count is highly recommended in high-risk pregnancies, but it is good for every pregnant woman to do. Your health care provider may ask you to start counting fetal movements at 28 weeks of the pregnancy. Fetal movements often increase: °· After eating a full meal. °· After physical activity. °· After eating or drinking something sweet or cold. °· At rest. °Pay attention to when you feel the baby is most active. This will help you notice a pattern of your baby's sleep and wake cycles and what factors contribute to an increase in fetal movement. It is important to perform a fetal movement count at the same time each day when your baby is normally most active.  °HOW TO COUNT FETAL MOVEMENTS °1. Find a quiet and comfortable area to sit or lie down on your left side. Lying on your left side provides the best blood and oxygen circulation to your baby. °2. Write down the day and time on a sheet of paper or in a journal. °3. Start counting kicks, flutters, swishes, rolls, or jabs in a 2-hour period. You should feel at least 10 movements within 2 hours. °4. If you do not feel 10 movements in 2 hours, wait 2-3 hours and count again. Look for a change in the pattern or not enough counts in 2 hours. °SEEK MEDICAL CARE IF: °· You feel less than 10 counts in 2 hours, tried twice. °· There is no movement in over an hour. °· The pattern is changing or taking longer each day to reach 10 counts in 2 hours. °· You feel the baby is not moving as he or she usually does. °Date: ____________ Movements: ____________ Start time: ____________ Finish time: ____________  °Date: ____________ Movements: ____________ Start time: ____________ Finish time: ____________ °Date: ____________ Movements: ____________ Start time: ____________ Finish time: ____________ °Date: ____________ Movements:  ____________ Start time: ____________ Finish time: ____________ °Date: ____________ Movements: ____________ Start time: ____________ Finish time: ____________ °Date: ____________ Movements: ____________ Start time: ____________ Finish time: ____________ °Date: ____________ Movements: ____________ Start time: ____________ Finish time: ____________ °Date: ____________ Movements: ____________ Start time: ____________ Finish time: ____________  °Date: ____________ Movements: ____________ Start time: ____________ Finish time: ____________ °Date: ____________ Movements: ____________ Start time: ____________ Finish time: ____________ °Date: ____________ Movements: ____________ Start time: ____________ Finish time: ____________ °Date: ____________ Movements: ____________ Start time: ____________ Finish time: ____________ °Date: ____________ Movements: ____________ Start time: ____________ Finish time: ____________ °Date: ____________ Movements: ____________ Start time: ____________ Finish time: ____________ °Date: ____________ Movements: ____________ Start time: ____________ Finish time: ____________  °Date: ____________ Movements: ____________ Start time: ____________ Finish time: ____________ °Date: ____________ Movements: ____________ Start time: ____________ Finish time: ____________ °Date: ____________ Movements: ____________ Start time: ____________ Finish time: ____________ °Date: ____________ Movements: ____________ Start time: ____________ Finish time: ____________ °Date: ____________ Movements: ____________ Start time: ____________ Finish time: ____________ °Date: ____________ Movements: ____________ Start time: ____________ Finish time: ____________ °Date: ____________ Movements: ____________ Start time: ____________ Finish time: ____________  °Date: ____________ Movements: ____________ Start time: ____________ Finish time: ____________ °Date: ____________ Movements: ____________ Start time: ____________ Finish  time: ____________ °Date: ____________ Movements: ____________ Start time: ____________ Finish time: ____________ °Date: ____________ Movements: ____________ Start time: ____________ Finish time: ____________ °Date: ____________ Movements: ____________ Start time: ____________ Finish time: ____________ °Date: ____________ Movements: ____________ Start time: ____________ Finish time: ____________ °Date: ____________ Movements: ____________ Start time: ____________ Finish time: ____________  °Date: ____________ Movements: ____________ Start time: ____________ Finish   time: ____________ Date: ____________ Movements: ____________ Start time: ____________ Amber Nguyen time: ____________ Date: ____________ Movements: ____________ Start time: ____________ Amber Nguyen time: ____________ Date: ____________ Movements: ____________ Start time: ____________ Amber Nguyen time: ____________ Date: ____________ Movements: ____________ Start time: ____________ Amber Nguyen time: ____________ Date: ____________ Movements: ____________ Start time: ____________ Amber Nguyen time: ____________ Date: ____________ Movements: ____________ Start time: ____________ Amber Nguyen time: ____________  Date: ____________ Movements: ____________ Start time: ____________ Amber Nguyen time: ____________ Date: ____________ Movements: ____________ Start time: ____________ Amber Nguyen time: ____________ Date: ____________ Movements: ____________ Start time: ____________ Amber Nguyen time: ____________ Date: ____________ Movements: ____________ Start time: ____________ Amber Nguyen time: ____________ Date: ____________ Movements: ____________ Start time: ____________ Amber Nguyen time: ____________ Date: ____________ Movements: ____________ Start time: ____________ Amber Nguyen time: ____________ Date: ____________ Movements: ____________ Start time: ____________ Amber Nguyen time: ____________  Date: ____________ Movements: ____________ Start time: ____________ Amber Nguyen time: ____________ Date: ____________  Movements: ____________ Start time: ____________ Amber Nguyen time: ____________ Date: ____________ Movements: ____________ Start time: ____________ Amber Nguyen time: ____________ Date: ____________ Movements: ____________ Start time: ____________ Amber Nguyen time: ____________ Date: ____________ Movements: ____________ Start time: ____________ Amber Nguyen time: ____________ Date: ____________ Movements: ____________ Start time: ____________ Amber Nguyen time: ____________ Date: ____________ Movements: ____________ Start time: ____________ Amber Nguyen time: ____________  Date: ____________ Movements: ____________ Start time: ____________ Amber Nguyen time: ____________ Date: ____________ Movements: ____________ Start time: ____________ Amber Nguyen time: ____________ Date: ____________ Movements: ____________ Start time: ____________ Amber Nguyen time: ____________ Date: ____________ Movements: ____________ Start time: ____________ Amber Nguyen time: ____________ Date: ____________ Movements: ____________ Start time: ____________ Amber Nguyen time: ____________ Date: ____________ Movements: ____________ Start time: ____________ Amber Nguyen time: ____________ Document Released: 03/20/2006 Document Revised: 07/05/2013 Document Reviewed: 12/16/2011 Exit[employer] Patient Information 2015 [employer]. This information is not intended to replace advice given to you by your health care provider. Make sure you discuss any questions you have with your health care provider. Hypertension Hypertension, commonly called high blood pressure, is when the force of blood pumping through your arteries is too strong. Your arteries are the blood vessels that carry blood from your heart throughout your body. A blood pressure reading consists of a higher number over a lower number, such as 110/72. The higher number (systolic) is the pressure inside your arteries when your heart pumps. The lower number (diastolic) is the pressure inside your arteries when your heart relaxes. Ideally you want  your blood pressure below 120/80. Hypertension forces your heart to work harder to pump blood. Your arteries may become narrow or stiff. Having hypertension puts you at risk for heart disease, stroke, and other problems.  RISK FACTORS Some risk factors for high blood pressure are controllable. Others are not.  Risk factors you cannot control include:   Race. You may be at higher risk if you are African American.  Age. Risk increases with age.  Gender. Men are at higher risk than women before age 71 years. After age 78, women are at higher risk than men. Risk factors you can control include:  Not getting enough exercise or physical activity.  Being overweight.  Getting too much fat, sugar, calories, or salt in your diet.  Drinking too much alcohol. SIGNS AND SYMPTOMS Hypertension does not usually cause signs or symptoms. Extremely high blood pressure (hypertensive crisis) may cause headache, anxiety, shortness of breath, and nosebleed. DIAGNOSIS  To check if you have hypertension, your health care provider will measure your blood pressure while you are seated, with your arm held at the level of your heart. It should be measured at least twice using the same arm. Certain conditions can cause a  difference in blood pressure between your right and left arms. A blood pressure reading that is higher than normal on one occasion does not mean that you need treatment. If one blood pressure reading is high, ask your health care provider about having it checked again. TREATMENT  Treating high blood pressure includes making lifestyle changes and possibly taking medicine. Living a healthy lifestyle can help lower high blood pressure. You may need to change some of your habits. Lifestyle changes may include:  Following the DASH diet. This diet is high in fruits, vegetables, and whole grains. It is low in salt, red meat, and added sugars.  Getting at least 2 hours of brisk physical activity every  week.  Losing weight if necessary.  Not smoking.  Limiting alcoholic beverages.  Learning ways to reduce stress. If lifestyle changes are not enough to get your blood pressure under control, your health care provider may prescribe medicine. You may need to take more than one. Work closely with your health care provider to understand the risks and benefits. HOME CARE INSTRUCTIONS  Have your blood pressure rechecked as directed by your health care provider.   Take medicines only as directed by your health care provider. Follow the directions carefully. Blood pressure medicines must be taken as prescribed. The medicine does not work as well when you skip doses. Skipping doses also puts you at risk for problems.   Do not smoke.   Monitor your blood pressure at home as directed by your health care provider. SEEK MEDICAL CARE IF:   You think you are having a reaction to medicines taken.  You have recurrent headaches or feel dizzy.  You have swelling in your ankles.  You have trouble with your vision. SEEK IMMEDIATE MEDICAL CARE IF:  You develop a severe headache or confusion.  You have unusual weakness, numbness, or feel faint.  You have severe chest or abdominal pain.  You vomit repeatedly.  You have trouble breathing. MAKE SURE YOU:   Understand these instructions.  Will watch your condition.  Will get help right away if you are not doing well or get worse. Document Released: 02/18/2005 Document Revised: 07/05/2013 Document Reviewed: 12/11/2012 [facility] Patient Information 2015 [employer] [place]. This information is not intended to replace advice given to you by your health care provider. Make sure you discuss any questions you have with your health care provider.

## 2013-10-07 NOTE — MAU Provider Note (Signed)
Addendum  Filed Vitals:   10/07/13 1346 10/07/13 1400 10/07/13 1416 10/07/13 1533  BP: 151/86 138/91 152/93 147/85  Pulse: 71 71 72 66  Temp:      TempSrc:      Resp:    16  Height:      Weight:      SpO2:        Lab result WNL Continue to observe Give labetalol 100mg  po SVE for dilation Consulted with Dr. Burna FortsKulwa   Jenesis Martin, CNM, MSN 10/07/2013. 4:06 PM

## 2013-10-07 NOTE — MAU Note (Signed)
Patient is scheduled for a primary cesarean section on 8-14 for cholestasis and the baby is breech.

## 2013-10-07 NOTE — Progress Notes (Signed)
Patient discharge instructions reviewed including follow up and work restrictions; Per V. Standard CNM patient to be on bedrest and be out of work for the next week until further evaluation. Work noted given to patient. Patient verbalized understanding.

## 2013-10-07 NOTE — MAU Provider Note (Signed)
Addendum  VE C/T/H Category 1 Last ctx 30 minutes ago Filed Vitals:   10/07/13 1533 10/07/13 1634 10/07/13 1733 10/07/13 1816  BP: 147/85 148/86 134/84 137/87  Pulse: 66 76 85 83  Temp:      TempSrc:      Resp: 16   16  Height:      Weight:      SpO2:       DC to home on modified bedrest FU with office appointment on Monday Labetalol,100mg  PO QD  Consulted with Dr Burna FortsKulwa  Loida Calamia, CNM< MSN

## 2013-10-11 ENCOUNTER — Other Ambulatory Visit: Payer: Self-pay | Admitting: Obstetrics and Gynecology

## 2013-10-13 DIAGNOSIS — O26619 Liver and biliary tract disorders in pregnancy, unspecified trimester: Secondary | ICD-10-CM

## 2013-10-13 DIAGNOSIS — E66811 Obesity, class 1: Secondary | ICD-10-CM | POA: Diagnosis present

## 2013-10-13 DIAGNOSIS — R7989 Other specified abnormal findings of blood chemistry: Secondary | ICD-10-CM | POA: Diagnosis present

## 2013-10-13 DIAGNOSIS — K831 Obstruction of bile duct: Secondary | ICD-10-CM | POA: Diagnosis present

## 2013-10-13 DIAGNOSIS — R945 Abnormal results of liver function studies: Secondary | ICD-10-CM

## 2013-10-13 DIAGNOSIS — E669 Obesity, unspecified: Secondary | ICD-10-CM | POA: Diagnosis present

## 2013-10-13 NOTE — H&P (Addendum)
Amber Nguyen is a 28 y.o. female, G1 P0 at 37.2 weeks on October 15, 2013.  Pt denies pain, vb or lof w/+FM.   All information will be verified at admission  Patient Active Problem List   Diagnosis Date Noted  . Morbid obesity 10/13/2013  . Cholestasis of pregnancy 10/13/2013  . Elevated LFTs 10/13/2013  . Hereditary disease in family possibly affecting fetus, affecting management of mother, antepartum condition or complication 04/09/2013    Pregnancy Course: Patient entered care at 9 weeks.   EDC of 11/03/2013 was established by US.    US evaluations:  9.1 weeks - Viability US WNL          19.6 weeks-  Anatomy scan: done at [facility]--WNL, no evidence of fetal kidney issue,       normal growth and anatomy, cervix WNL, LV EIF.   Posterior placenta      23.6 weeks - FU scan - EIF seen, EFW 48%tile, no structural defects seen, no marker      assoiciated with aneuploidy, normal fluid      34.2 weeks - BPP 8/8, AFI 13.86, FHR 153      35.2 weeks - AFI 12.9, FHR 134, Breech       36.1 weeks - BPP 8/8, AFI 12.5, FHR 143, [place] Breech          36.1 weeks - BPP 8/8, AFI 18.2, FHR 134, Frank Breech, posterior placenta  Significant prenatal events:   Diagnosis of Cholestasis of pregnancy  Last evaluation:   37.1 weeks   VE: deferred  Reason for admission:  Primary CS secondary to breech presentation and cholestasis of pregnancy  Pt States:   Contractions Frequency: none         Contraction severity: none         Fetal activity: +FM   OB History   Grav Para Term Preterm Abortions TAB SAB Ect Mult Living   1 0 0 0 0 0 0 0 0 0      Past Medical History  Diagnosis Date  . 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 Cholecystitis    Past Surgical History  Procedure Laterality Date  . Tubes in ears     Family History: family history includes Cancer in her maternal grandmother; Diabetes in her paternal grandfather and paternal grandmother; Hypertension in her father; Thyroid disease in her  mother. Family history of polycystic kidney disease, nephew Social History:  reports that she has never smoked. She does not have any smokeless tobacco history on file. She reports that she does not drink alcohol or use illicit drugs.   Prenatal Transfer Tool  Maternal Diabetes: No Genetic Screening: Normal Maternal Ultrasounds/Referrals: Abnormal:  Findings:   Isolated EIF (echogenic intracardiac focus) Fetal Ultrasounds or other Referrals:  Referred to Materal Fetal Medicine  Maternal Substance Abuse:  No Significant Maternal Medications:  Meds include: Other: Ursodiol 300mg , Diclegis 10mg  Significant Maternal Lab Results:  09/09/2013  INDIRECT BILIRUBIN NOT CALC 0.2-1.2 mg/dL Normal Final  ALKALINE PHOSPHATASE 129 39-117 U/L High  Final  AST/SGOT 14 0-37 U/L   Normal Final  ALT/SGPT 11 0-35 U/L   Normal Final  TOTAL PROTEIN 6.9 6.0-8.3 g/dL  Normal Final  ALBUMIN 3.5 3.5-5.2 g/dL   Normal Final SODIUM 161136 135-145 mEq/L  Normal Final  POTASSIUM 4.6 3.5-5.3 mEq/L   Normal Final  CHLORIDE 103 96-112 mEq/L   Normal Final  CO2 21 19-32 mEq/L    Normal Final  GLUCOSE 74  70-99 mg/dL   Normal Final  BUN 6 1-61 mg/dL    Normal Final  CREATININE 0.73 0.50-1.10 mg/dL  Normal Final  BILIRUBIN, TOTAL 0.2 0.2-1.2 mg/dL  Normal Final BILE ACIDS, TOTAL 11 0-19 umol/L  Normal Final  10/07/2013 ALBUMIN 3.3 3.5-5.2 g/dL   Low  Final PC RATIO 0.96 <0.15    Normal Final URIC ACID 5.9 2.4-7.0 mg/dL   Normal Final NST       Non reassuring    ROS:  See HPI above, all other systems are negative  No Known Allergies   Last menstrual period 01/21/2013.   Maternal Exam:  Uterine Assessment: none Abdomen: Gravid, non tender. Fundal height is aga.  Normal external genitalia, vulva, cervix, uterus and adnexa.  Lesions noted on exam: N/A Pelvis adequate for delivery: N/A Fetal presentation: Breech by [place] 10/14/2013   Fetal Exam:  Fetal Monitor Surveillance : Continuous Monitoring   Mode:  Ultrasound.  FHR: +FHTs CTXs: none    Physical Exam: Nursing note and vitals reviewed General: alert and cooperative She appears well nourished.  Psychiatric: Normal mood and affect. Her behavior is normal.  Head: Normocephalic.  Eyes: Pupils are equal, round, and reactive to light.  Neck: Normal range of motion.  Cardiovascular: RRR without murmur.  Respiratory: CTAB. Effort normal.  Abd: gravida, soft, non-tender, +BS, no rebound, no guarding  Genitourinary: Vagina normal.  Musculoskeletal: Normal range of motion.  Ext: WNL, No evidence of DVT seen on physical exam. Homan's sign negative bilaterally Minimal bilaterally non-pitting edema Neurological: A&Ox3.  Normal reflexes.  Skin: Warm and dry.    Prenatal labs: ABO, Rh:  A+ Antibody:  neg Rubella:   immune RPR:   NR HBsAg:   neg HIV:   neg GBS:  neg Sickle cell/Hgb electrophoresis:  WNL Pap:   ABN 03/2007,  WNL 07/2012  GC:  neg Chlamydia: neg Genetic screenings:  wnl Glucola:  wnl   Assessment IUP at 37.2 Weeks Membrane: intact  GBS negative Cholestasis of pregnancy Frank breech  Plan: Admit to [facility] for scheduled primary CS secondary fetal presentation and cholestasis of pregnancy R&B of CS reviewed with patient and family.  Pt and family verbalize understanding of the procedure and agree with treatment plan.  Possibility of needing a blood transfusion reviewed.   Routine Pre-OP orders    Venus Standard, CNM, MSN 10/15/2013, 8:00am  Pt seen by MFM and rec delivery at 37wks secondary to cholestasis of pregnency.  Pt was breech on yesterday's ultrasound but today when I scan her she appears to be vertex.  I discussed induction with the patient but she states that she would much rather proceed with c-section now that she is here and has prepared herself for that.  R/B/A discussed with the patient including but not limited to B/I/I.  Questions answered and consent signed and witnessed.

## 2013-10-14 ENCOUNTER — Encounter (HOSPITAL_COMMUNITY): Payer: Self-pay

## 2013-10-14 ENCOUNTER — Encounter (HOSPITAL_COMMUNITY)
Admission: RE | Admit: 2013-10-14 | Discharge: 2013-10-14 | Disposition: A | Discharge status: Home / Self Care | Payer: BC Managed Care – PPO | Source: Ambulatory Visit | Attending: Obstetrics and Gynecology | Admitting: Obstetrics and Gynecology

## 2013-10-14 HISTORY — DX: Anxiety disorder, unspecified: F41.9

## 2013-10-14 HISTORY — DX: Other specified pregnancy related conditions, unspecified trimester: R12

## 2013-10-14 HISTORY — DX: Other specified pregnancy related conditions, unspecified trimester: O26.899

## 2013-10-14 LAB — CBC
HEMATOCRIT: 34.9 % — AB (ref 36.0–46.0)
Hemoglobin: 11.7 g/dL — ABNORMAL LOW (ref 12.0–15.0)
MCH: 31.6 pg (ref 26.0–34.0)
MCHC: 33.5 g/dL (ref 30.0–36.0)
MCV: 94.3 fL (ref 78.0–100.0)
Platelets: 212 10*3/uL (ref 150–400)
RBC: 3.7 MIL/uL — ABNORMAL LOW (ref 3.87–5.11)
RDW: 15 % (ref 11.5–15.5)
WBC: 8.5 10*3/uL (ref 4.0–10.5)

## 2013-10-14 LAB — RPR

## 2013-10-14 LAB — TYPE AND SCREEN
ABO/RH(D): A POS
ANTIBODY SCREEN: NEGATIVE

## 2013-10-14 LAB — ABO/RH: ABO/RH(D): A POS

## 2013-10-14 NOTE — Patient Instructions (Addendum)
Your procedure is scheduled on:10/15/13  Enter through the Main Entrance at :0645 am Pick up desk phone and dial [phone] and inform us of your arrival.  Please call [phone] if you have any problems the morning of surgery.  Remember: Do not eat food or drink liquids, including water, after midnight: tonight  You may brush your teeth the morning of surgery.    DO NOT wear jewelry, eye make-up, lipstick,body lotion, or dark fingernail polish.  (Polished toes are ok) You may wear deodorant.  If you are to be admitted after surgery, leave suitcase in car until your room has been assigned. Patients discharged on the day of surgery will not be allowed to drive home. Wear loose fitting, comfortable clothes for your ride home.

## 2013-10-15 ENCOUNTER — Encounter (HOSPITAL_COMMUNITY): Payer: Self-pay | Admitting: Anesthesiology

## 2013-10-15 ENCOUNTER — Encounter (HOSPITAL_COMMUNITY)
Admission: AD | Disposition: A | Discharge status: Home / Self Care | Payer: Self-pay | Source: Ambulatory Visit | Attending: Obstetrics and Gynecology

## 2013-10-15 ENCOUNTER — Inpatient Hospital Stay (HOSPITAL_COMMUNITY)
Admission: AD | Admit: 2013-10-15 | Discharge: 2013-10-18 | DRG: 765 | Disposition: A | Discharge status: Home / Self Care | Payer: BC Managed Care – PPO | Source: Ambulatory Visit | Attending: Obstetrics and Gynecology | Admitting: Obstetrics and Gynecology

## 2013-10-15 ENCOUNTER — Inpatient Hospital Stay (HOSPITAL_COMMUNITY): Payer: BC Managed Care – PPO | Admitting: Anesthesiology

## 2013-10-15 ENCOUNTER — Encounter (HOSPITAL_COMMUNITY): Payer: BC Managed Care – PPO | Admitting: Anesthesiology

## 2013-10-15 DIAGNOSIS — O1002 Pre-existing essential hypertension complicating childbirth: Secondary | ICD-10-CM | POA: Diagnosis present

## 2013-10-15 DIAGNOSIS — O26649 Intrahepatic cholestasis of pregnancy, unspecified trimester: Secondary | ICD-10-CM | POA: Diagnosis present

## 2013-10-15 DIAGNOSIS — R7989 Other specified abnormal findings of blood chemistry: Secondary | ICD-10-CM | POA: Diagnosis present

## 2013-10-15 DIAGNOSIS — Z8271 Family history of polycystic kidney: Secondary | ICD-10-CM

## 2013-10-15 DIAGNOSIS — K831 Obstruction of bile duct: Secondary | ICD-10-CM | POA: Diagnosis present

## 2013-10-15 DIAGNOSIS — O9903 Anemia complicating the puerperium: Secondary | ICD-10-CM | POA: Diagnosis present

## 2013-10-15 DIAGNOSIS — D649 Anemia, unspecified: Secondary | ICD-10-CM | POA: Diagnosis present

## 2013-10-15 DIAGNOSIS — E66811 Obesity, class 1: Secondary | ICD-10-CM | POA: Diagnosis present

## 2013-10-15 DIAGNOSIS — R945 Abnormal results of liver function studies: Secondary | ICD-10-CM

## 2013-10-15 DIAGNOSIS — Z98891 History of uterine scar from previous surgery: Secondary | ICD-10-CM

## 2013-10-15 DIAGNOSIS — O321XX0 Maternal care for breech presentation, not applicable or unspecified: Principal | ICD-10-CM | POA: Diagnosis present

## 2013-10-15 DIAGNOSIS — Z8249 Family history of ischemic heart disease and other diseases of the circulatory system: Secondary | ICD-10-CM

## 2013-10-15 DIAGNOSIS — O26619 Liver and biliary tract disorders in pregnancy, unspecified trimester: Secondary | ICD-10-CM | POA: Diagnosis present

## 2013-10-15 DIAGNOSIS — K838 Other specified diseases of biliary tract: Secondary | ICD-10-CM | POA: Diagnosis present

## 2013-10-15 DIAGNOSIS — E669 Obesity, unspecified: Secondary | ICD-10-CM | POA: Diagnosis present

## 2013-10-15 DIAGNOSIS — Z833 Family history of diabetes mellitus: Secondary | ICD-10-CM

## 2013-10-15 DIAGNOSIS — IMO0002 Reserved for concepts with insufficient information to code with codable children: Secondary | ICD-10-CM | POA: Diagnosis present

## 2013-10-15 LAB — COMPREHENSIVE METABOLIC PANEL
ALK PHOS: 135 U/L — AB (ref 39–117)
ALT: 8 U/L (ref 0–35)
AST: 14 U/L (ref 0–37)
Albumin: 2.3 g/dL — ABNORMAL LOW (ref 3.5–5.2)
Anion gap: 11 (ref 5–15)
BUN: 8 mg/dL (ref 6–23)
CALCIUM: 8.9 mg/dL (ref 8.4–10.5)
CO2: 21 mEq/L (ref 19–32)
Chloride: 103 mEq/L (ref 96–112)
Creatinine, Ser: 0.69 mg/dL (ref 0.50–1.10)
GFR calc Af Amer: >90 mL/min (ref >90)
GFR calc non Af Amer: >90 mL/min (ref >90)
Glucose, Bld: 77 mg/dL (ref 70–99)
POTASSIUM: 4.3 meq/L (ref 3.7–5.3)
SODIUM: 135 meq/L — AB (ref 137–147)
TOTAL PROTEIN: 6.1 g/dL (ref 6.0–8.3)
Total Bilirubin: <0.2 mg/dL — ABNORMAL LOW (ref 0.3–1.2)

## 2013-10-15 LAB — PROTEIN / CREATININE RATIO, URINE
CREATININE, URINE: 119.44 mg/dL
PROTEIN CREATININE RATIO: 0.39 — AB (ref 0.00–0.15)
TOTAL PROTEIN, URINE: 46.8 mg/dL

## 2013-10-15 LAB — MAGNESIUM: Magnesium: 2 mg/dL (ref 1.5–2.5)

## 2013-10-15 LAB — LACTATE DEHYDROGENASE: LDH: 192 U/L (ref 94–250)

## 2013-10-15 LAB — URIC ACID: URIC ACID, SERUM: 6.3 mg/dL (ref 2.4–7.0)

## 2013-10-15 SURGERY — Surgical Case
Anesthesia: Spinal | Site: Abdomen

## 2013-10-15 MED ORDER — PHENYLEPHRINE HCL 10 MG/ML IJ SOLN
INTRAMUSCULAR | Status: AC
  Filled 2013-10-15: qty 1

## 2013-10-15 MED ORDER — MEPERIDINE HCL 25 MG/ML IJ SOLN: 6.2500 mg | INTRAMUSCULAR | Status: DC | PRN

## 2013-10-15 MED ORDER — NALOXONE HCL 0.4 MG/ML IJ SOLN: 0.4000 mg | INTRAMUSCULAR | Status: DC | PRN

## 2013-10-15 MED ORDER — MORPHINE SULFATE 0.5 MG/ML IJ SOLN
INTRAMUSCULAR | Status: AC
  Filled 2013-10-15: qty 10

## 2013-10-15 MED ORDER — MAGNESIUM SULFATE 40 G IN LACTATED RINGERS - SIMPLE: 2.0000 g/h | INTRAVENOUS | Status: DC

## 2013-10-15 MED ORDER — SCOPOLAMINE 1 MG/3DAYS TD PT72
1.0000 | MEDICATED_PATCH | Freq: Once | TRANSDERMAL | Status: DC
  Administered 2013-10-15: 1.5 mg via TRANSDERMAL

## 2013-10-15 MED ORDER — KETOROLAC TROMETHAMINE 30 MG/ML IJ SOLN
30.0000 mg | Freq: Four times a day (QID) | INTRAMUSCULAR | Status: AC | PRN
  Administered 2013-10-15: 30 mg via INTRAVENOUS
  Filled 2013-10-15 (×2): qty 1

## 2013-10-15 MED ORDER — FENTANYL CITRATE 0.05 MG/ML IJ SOLN
INTRAMUSCULAR | Status: DC | PRN
  Administered 2013-10-15: 12.5 ug via INTRATHECAL

## 2013-10-15 MED ORDER — IBUPROFEN 600 MG PO TABS: 600.0000 mg | ORAL_TABLET | Freq: Four times a day (QID) | ORAL | Status: DC | PRN

## 2013-10-15 MED ORDER — BUPIVACAINE IN DEXTROSE 0.75-8.25 % IT SOLN
INTRATHECAL | Status: DC | PRN
  Administered 2013-10-15: 1.6 mL via INTRATHECAL

## 2013-10-15 MED ORDER — MAGNESIUM SULFATE 40 G IN LACTATED RINGERS - SIMPLE
1.0000 g/h | INTRAVENOUS | Status: DC
  Filled 2013-10-15: qty 500

## 2013-10-15 MED ORDER — LACTATED RINGERS IV SOLN
INTRAVENOUS | Status: DC
  Administered 2013-10-16: 100 mL/h via INTRAVENOUS
  Administered 2013-10-16: 15:00:00 via INTRAVENOUS

## 2013-10-15 MED ORDER — KETOROLAC TROMETHAMINE 30 MG/ML IJ SOLN: 15.0000 mg | Freq: Once | INTRAMUSCULAR | Status: DC | PRN

## 2013-10-15 MED ORDER — DIPHENHYDRAMINE HCL 50 MG/ML IJ SOLN: 25.0000 mg | INTRAMUSCULAR | Status: DC | PRN

## 2013-10-15 MED ORDER — METOCLOPRAMIDE HCL 5 MG/ML IJ SOLN: 10.0000 mg | Freq: Three times a day (TID) | INTRAMUSCULAR | Status: DC | PRN

## 2013-10-15 MED ORDER — NALBUPHINE HCL 10 MG/ML IJ SOLN
5.0000 mg | INTRAMUSCULAR | Status: DC | PRN
  Filled 2013-10-15: qty 1

## 2013-10-15 MED ORDER — WITCH HAZEL-GLYCERIN EX PADS: 1.0000 "application " | MEDICATED_PAD | CUTANEOUS | Status: DC | PRN

## 2013-10-15 MED ORDER — ONDANSETRON HCL 4 MG/2ML IJ SOLN
INTRAMUSCULAR | Status: DC | PRN
  Administered 2013-10-15: 4 mg via INTRAVENOUS

## 2013-10-15 MED ORDER — DIPHENHYDRAMINE HCL 25 MG PO CAPS: 25.0000 mg | ORAL_CAPSULE | ORAL | Status: DC | PRN

## 2013-10-15 MED ORDER — ONDANSETRON HCL 4 MG/2ML IJ SOLN: 4.0000 mg | Freq: Three times a day (TID) | INTRAMUSCULAR | Status: DC | PRN

## 2013-10-15 MED ORDER — LACTATED RINGERS IV SOLN
INTRAVENOUS | Status: DC | PRN
  Administered 2013-10-15: 09:00:00 via INTRAVENOUS

## 2013-10-15 MED ORDER — DIPHENHYDRAMINE HCL 50 MG/ML IJ SOLN: 12.5000 mg | INTRAMUSCULAR | Status: DC | PRN

## 2013-10-15 MED ORDER — MAGNESIUM SULFATE 40 G IN LACTATED RINGERS - SIMPLE: 4.0000 g/h | Freq: Once | INTRAVENOUS | Status: DC

## 2013-10-15 MED ORDER — MORPHINE SULFATE (PF) 0.5 MG/ML IJ SOLN
INTRAMUSCULAR | Status: DC | PRN
  Administered 2013-10-15: .2 mg via INTRATHECAL

## 2013-10-15 MED ORDER — PHENYLEPHRINE 8 MG IN D5W 100 ML (0.08MG/ML) PREMIX OPTIME
INJECTION | INTRAVENOUS | Status: DC | PRN
  Administered 2013-10-15: 30 ug/min via INTRAVENOUS

## 2013-10-15 MED ORDER — DEXTROSE 5 % IV SOLN
1.0000 ug/kg/h | INTRAVENOUS | Status: DC | PRN
  Filled 2013-10-15: qty 2

## 2013-10-15 MED ORDER — CEFAZOLIN SODIUM-DEXTROSE 2-3 GM-% IV SOLR
INTRAVENOUS | Status: AC
  Administered 2013-10-15: 2 g via INTRAVENOUS
  Filled 2013-10-15: qty 50

## 2013-10-15 MED ORDER — SODIUM CHLORIDE 0.9 % IJ SOLN
3.0000 mL | INTRAMUSCULAR | Status: DC | PRN
  Administered 2013-10-16: 3 mL via INTRAVENOUS

## 2013-10-15 MED ORDER — DIBUCAINE 1 % RE OINT
1.0000 "application " | TOPICAL_OINTMENT | RECTAL | Status: DC | PRN
  Filled 2013-10-15: qty 28

## 2013-10-15 MED ORDER — OXYCODONE-ACETAMINOPHEN 5-325 MG PO TABS
1.0000 | ORAL_TABLET | ORAL | Status: DC | PRN
  Administered 2013-10-15: 2 via ORAL
  Administered 2013-10-16 (×2): 1 via ORAL
  Administered 2013-10-16: 2 via ORAL
  Administered 2013-10-16 (×2): 1 via ORAL
  Administered 2013-10-17 (×6): 2 via ORAL
  Administered 2013-10-18 (×3): 1 via ORAL
  Filled 2013-10-15: qty 2
  Filled 2013-10-15: qty 1
  Filled 2013-10-15 (×3): qty 2
  Filled 2013-10-15: qty 1
  Filled 2013-10-15: qty 2
  Filled 2013-10-15: qty 1
  Filled 2013-10-15: qty 2
  Filled 2013-10-15 (×4): qty 1
  Filled 2013-10-15 (×2): qty 2

## 2013-10-15 MED ORDER — ONDANSETRON HCL 4 MG PO TABS: 4.0000 mg | ORAL_TABLET | ORAL | Status: DC | PRN

## 2013-10-15 MED ORDER — NALBUPHINE HCL 10 MG/ML IJ SOLN
5.0000 mg | INTRAMUSCULAR | Status: DC | PRN
  Administered 2013-10-15: 5 mg via INTRAVENOUS
  Administered 2013-10-15: 10 mg via INTRAVENOUS
  Filled 2013-10-15 (×4): qty 1

## 2013-10-15 MED ORDER — OXYTOCIN 40 UNITS IN LACTATED RINGERS INFUSION - SIMPLE MED: 62.5000 mL/h | INTRAVENOUS | Status: AC

## 2013-10-15 MED ORDER — ZOLPIDEM TARTRATE 5 MG PO TABS: 5.0000 mg | ORAL_TABLET | Freq: Every evening | ORAL | Status: DC | PRN

## 2013-10-15 MED ORDER — PROMETHAZINE HCL 25 MG/ML IJ SOLN: 6.2500 mg | INTRAMUSCULAR | Status: DC | PRN

## 2013-10-15 MED ORDER — SENNOSIDES-DOCUSATE SODIUM 8.6-50 MG PO TABS
2.0000 | ORAL_TABLET | ORAL | Status: DC
  Administered 2013-10-15 – 2013-10-17 (×3): 2 via ORAL
  Filled 2013-10-15 (×3): qty 2

## 2013-10-15 MED ORDER — OXYTOCIN 10 UNIT/ML IJ SOLN
40.0000 [IU] | INTRAVENOUS | Status: DC | PRN
  Administered 2013-10-15: 40 [IU] via INTRAVENOUS

## 2013-10-15 MED ORDER — SIMETHICONE 80 MG PO CHEW
80.0000 mg | CHEWABLE_TABLET | Freq: Three times a day (TID) | ORAL | Status: DC
  Administered 2013-10-15 – 2013-10-18 (×8): 80 mg via ORAL
  Filled 2013-10-15 (×7): qty 1

## 2013-10-15 MED ORDER — PRENATAL MULTIVITAMIN CH
1.0000 | ORAL_TABLET | Freq: Every day | ORAL | Status: DC
  Administered 2013-10-16 – 2013-10-18 (×3): 1 via ORAL
  Filled 2013-10-15 (×3): qty 1

## 2013-10-15 MED ORDER — TETANUS-DIPHTH-ACELL PERTUSSIS 5-2.5-18.5 LF-MCG/0.5 IM SUSP
0.5000 mL | Freq: Once | INTRAMUSCULAR | Status: DC
  Filled 2013-10-15: qty 0.5

## 2013-10-15 MED ORDER — IBUPROFEN 600 MG PO TABS
600.0000 mg | ORAL_TABLET | Freq: Four times a day (QID) | ORAL | Status: DC
  Administered 2013-10-15 – 2013-10-18 (×11): 600 mg via ORAL
  Filled 2013-10-15 (×11): qty 1

## 2013-10-15 MED ORDER — ONDANSETRON HCL 4 MG/2ML IJ SOLN: 4.0000 mg | INTRAMUSCULAR | Status: DC | PRN

## 2013-10-15 MED ORDER — DIPHENHYDRAMINE HCL 25 MG PO CAPS
25.0000 mg | ORAL_CAPSULE | Freq: Four times a day (QID) | ORAL | Status: DC | PRN
Start: 2013-10-15 — End: 2013-10-18

## 2013-10-15 MED ORDER — SIMETHICONE 80 MG PO CHEW: 80.0000 mg | CHEWABLE_TABLET | ORAL | Status: DC | PRN

## 2013-10-15 MED ORDER — SIMETHICONE 80 MG PO CHEW
80.0000 mg | CHEWABLE_TABLET | ORAL | Status: DC
  Administered 2013-10-15 – 2013-10-17 (×3): 80 mg via ORAL
  Filled 2013-10-15 (×4): qty 1

## 2013-10-15 MED ORDER — ONDANSETRON HCL 4 MG/2ML IJ SOLN
INTRAMUSCULAR | Status: AC
  Filled 2013-10-15: qty 2

## 2013-10-15 MED ORDER — FENTANYL CITRATE 0.05 MG/ML IJ SOLN
INTRAMUSCULAR | Status: AC
  Filled 2013-10-15: qty 2

## 2013-10-15 MED ORDER — LACTATED RINGERS IV SOLN
INTRAVENOUS | Status: DC
  Administered 2013-10-15 (×3): via INTRAVENOUS

## 2013-10-15 MED ORDER — MENTHOL 3 MG MT LOZG
1.0000 | LOZENGE | OROMUCOSAL | Status: DC | PRN
  Filled 2013-10-15: qty 9

## 2013-10-15 MED ORDER — SCOPOLAMINE 1 MG/3DAYS TD PT72
MEDICATED_PATCH | TRANSDERMAL | Status: AC
  Filled 2013-10-15: qty 1

## 2013-10-15 MED ORDER — LANOLIN HYDROUS EX OINT: 1.0000 "application " | TOPICAL_OINTMENT | CUTANEOUS | Status: DC | PRN

## 2013-10-15 MED ORDER — KETOROLAC TROMETHAMINE 30 MG/ML IJ SOLN
30.0000 mg | Freq: Four times a day (QID) | INTRAMUSCULAR | Status: AC | PRN
  Filled 2013-10-15: qty 1

## 2013-10-15 MED ORDER — MAGNESIUM SULFATE BOLUS VIA INFUSION
4.0000 g | Freq: Once | INTRAVENOUS | Status: AC
  Administered 2013-10-15: 4 g via INTRAVENOUS
  Filled 2013-10-15: qty 500

## 2013-10-15 MED ORDER — HYDROMORPHONE HCL PF 1 MG/ML IJ SOLN: 0.2500 mg | INTRAMUSCULAR | Status: DC | PRN

## 2013-10-15 MED ORDER — OXYTOCIN 10 UNIT/ML IJ SOLN
INTRAMUSCULAR | Status: AC
  Filled 2013-10-15: qty 4

## 2013-10-15 SURGICAL SUPPLY — 39 items
APL SKNCLS STERI-STRIP NONHPOA (GAUZE/BANDAGES/DRESSINGS) ×1
BENZOIN TINCTURE PRP APPL 2/3 (GAUZE/BANDAGES/DRESSINGS) ×2 IMPLANT
BLADE SURG 10 STRL SS (BLADE) ×4 IMPLANT
CLAMP CORD UMBIL (MISCELLANEOUS) IMPLANT
CLOTH BEACON ORANGE TIMEOUT ST (SAFETY) ×2 IMPLANT
CONTAINER PREFILL 10% NBF 15ML (MISCELLANEOUS) IMPLANT
DRAIN JACKSON PRT FLT 7MM (DRAIN) ×1 IMPLANT
DRAPE LG THREE QUARTER DISP (DRAPES) IMPLANT
DRSG OPSITE POSTOP 4X10 (GAUZE/BANDAGES/DRESSINGS) ×2 IMPLANT
DURAPREP 26ML APPLICATOR (WOUND CARE) ×2 IMPLANT
ELECT REM PT RETURN 9FT ADLT (ELECTROSURGICAL) ×2
ELECTRODE REM PT RTRN 9FT ADLT (ELECTROSURGICAL) ×1 IMPLANT
EVACUATOR SILICONE 100CC (DRAIN) ×1 IMPLANT
EXTRACTOR VACUUM M CUP 4 TUBE (SUCTIONS) IMPLANT
GLOVE BIO SURGEON STRL SZ7.5 (GLOVE) ×2 IMPLANT
GLOVE BIOGEL PI IND STRL 7.5 (GLOVE) ×1 IMPLANT
GLOVE BIOGEL PI INDICATOR 7.5 (GLOVE) ×1
GOWN STRL REUS W/TWL LRG LVL3 (GOWN DISPOSABLE) ×4 IMPLANT
KIT ABG SYR 3ML LUER SLIP (SYRINGE) IMPLANT
NDL HYPO 25X5/8 SAFETYGLIDE (NEEDLE) IMPLANT
NEEDLE HYPO 25X5/8 SAFETYGLIDE (NEEDLE) IMPLANT
NS IRRIG 1000ML POUR BTL (IV SOLUTION) ×2 IMPLANT
PACK C SECTION WH (CUSTOM PROCEDURE TRAY) ×2 IMPLANT
PAD OB MATERNITY 4.3X12.25 (PERSONAL CARE ITEMS) ×2 IMPLANT
RETRACTOR WND ALEXIS 25 LRG (MISCELLANEOUS) ×1 IMPLANT
RTRCTR WOUND ALEXIS 25CM LRG (MISCELLANEOUS) ×2
SPONGE DRAIN TRACH 4X4 STRL 2S (GAUZE/BANDAGES/DRESSINGS) ×1 IMPLANT
STRIP CLOSURE SKIN 1/2X4 (GAUZE/BANDAGES/DRESSINGS) ×2 IMPLANT
SUT CHROMIC 2 0 CT 1 (SUTURE) ×2 IMPLANT
SUT MNCRL AB 3-0 PS2 27 (SUTURE) ×2 IMPLANT
SUT PLAIN 0 NONE (SUTURE) IMPLANT
SUT PLAIN 2 0 XLH (SUTURE) ×2 IMPLANT
SUT SILK 2 0 FS (SUTURE) ×1 IMPLANT
SUT VIC AB 0 CT1 36 (SUTURE) ×2 IMPLANT
SUT VIC AB 0 CTX 36 (SUTURE) ×6
SUT VIC AB 0 CTX36XBRD ANBCTRL (SUTURE) ×3 IMPLANT
TOWEL OR 17X24 6PK STRL BLUE (TOWEL DISPOSABLE) ×2 IMPLANT
TRAY FOLEY CATH 14FR (SET/KITS/TRAYS/PACK) ×2 IMPLANT
WATER STERILE IRR 1000ML POUR (IV SOLUTION) ×2 IMPLANT

## 2013-10-15 NOTE — Anesthesia Preprocedure Evaluation (Signed)
Anesthesia Evaluation  Patient identified by MRN, date of birth, ID band Patient awake    Reviewed: Allergy & Precautions, H&P , NPO status , Patient's Chart, lab work & pertinent test results  Airway Mallampati: II TM Distance: >3 FB Neck ROM: full    Dental no notable dental hx.    Pulmonary neg pulmonary ROS,    Pulmonary exam normal       Cardiovascular hypertension, Pt. on home beta blockers     Neuro/Psych negative neurological ROS     GI/Hepatic negative GI ROS, Neg liver ROS,   Endo/Other  negative endocrine ROS  Renal/GU negative Renal ROS     Musculoskeletal   Abdominal (+) + obese,   Peds  Hematology negative hematology ROS (+)   Anesthesia Other Findings   Reproductive/Obstetrics (+) Pregnancy                           Anesthesia Physical Anesthesia Plan  ASA: II  Anesthesia Plan: Spinal   Post-op Pain Management:    Induction:   Airway Management Planned:   Additional Equipment:   Intra-op Plan:   Post-operative Plan:   Informed Consent: I have reviewed the patients History and Physical, chart, labs and discussed the procedure including the risks, benefits and alternatives for the proposed anesthesia with the patient or authorized representative who has indicated his/her understanding and acceptance.     Plan Discussed with: CRNA and Surgeon  Anesthesia Plan Comments:         Anesthesia Quick Evaluation

## 2013-10-15 NOTE — Anesthesia Procedure Notes (Signed)
Spinal  Patient location during procedure: OR Start time: 10/15/2013 8:35 AM End time: 10/15/2013 8:38 AM Staffing Anesthesiologist: Leilani AbleHATCHETT, Tekela Garguilo Performed by: anesthesiologist  Preanesthetic Checklist Completed: patient identified, surgical consent, pre-op evaluation, timeout performed, IV checked, risks and benefits discussed and monitors and equipment checked Spinal Block Patient position: sitting Prep: site prepped and draped and DuraPrep Patient monitoring: heart rate, cardiac monitor, continuous pulse ox and blood pressure Approach: midline Location: L3-4 Injection technique: single-shot Needle Needle type: Pencan  Needle gauge: 24 G Needle length: 9 cm Needle insertion depth: 6 cm Assessment Sensory level: T4

## 2013-10-15 NOTE — Transfer of Care (Signed)
Immediate Anesthesia Transfer of Care Note  Patient: Amber Nguyen  Procedure(s) Performed: Procedure(s): PRIMARY CESAREAN SECTION (N/A)  Patient Location: PACU  Anesthesia Type:Spinal  Level of Consciousness: awake and alert   Airway & Oxygen Therapy: Patient Spontanous Breathing  Post-op Assessment: Report given to PACU RN and Post -op Vital signs reviewed and stable  Post vital signs: Reviewed and stable  Complications: No apparent anesthesia complications

## 2013-10-15 NOTE — Op Note (Signed)
Cesarean Section Procedure Note  Indications: 37 2/7wks with cholestasis of pregnancy and recommended delivery by MFM at 37wks initially with breech presentation (last ultrasound yesterday) however upon scanning the patient myself this morning baby was found to be vertex.  Although I discussed proceeding with induction rather than c-section, the patient wanted to proceed with the c-section as planned.  She said she had prepared herself for the c-section today and wants to proceed.    Pre-operative Diagnosis: 1.37 2/7wks 2.Cholestasis of pregnancy 3.Desires to proceed with primary c-section   Post-operative Diagnosis: 1.37 2/7wks 2.Cholestasis of pregnancy 3.Desires to proceed with primary c-section  Procedure: Primary C-Section  Surgeon: Purcell Nails, MD    Assistants: Adelina Mings, CNM  Anesthesia: Regional  Anesthesiologist: Leilani Able, MD   Procedure Details  The patient was taken to the operating room secondary to desire for primary c-section with cholestasis of pregnency after the risks, benefits, complications, treatment options, and expected outcomes were discussed with the patient.  The patient concurred with the proposed plan, giving informed consent which was signed and witnessed. The patient was taken to Operating Room 1, identified as Amber Nguyen and the procedure verified as C-Section Delivery. A Time Out was held and the above information confirmed.  After induction of anesthesia by obtaining a spinal, the patient was prepped and draped in the usual sterile manner. A Pfannenstiel skin incision was made and carried down through the subcutaneous tissue to the underlying layer of fascia.  The fascia was incised bilaterally and extended transversely bilaterally with the Mayo scissors. Kocher clamps were placed on the inferior aspect of the fascial incision and the underlying rectus muscle was separated from the fascia. The same was done on the superior aspect of the  fascial incision.  The peritoneum was identified, entered bluntly and extended manually.  An Alexis self-retaining retractor was placed.  The utero-vesical peritoneal reflection was incised transversely and the bladder flap was bluntly freed from the lower uterine segment. A low transverse uterine incision was made with the scalpel and extended bilaterally with the bandage scissors.  The infant was delivered in vertex position without difficulty.  After the umbilical cord was clamped and cut, the infant was handed to the awaiting pediatricians.  Cord blood was obtained for evaluation.  The placenta was removed intact and appeared to be within normal limits. The uterus was cleared of all clots and debris. The uterine incision was closed with running interlocking sutures of 0 Vicryl and a second imbricating layer was performed as well.   Bilateral tubes and ovaries appeared to be within normal limits.  Good hemostasis was noted.  Copious irrigation was performed until clear.  The peritoneum was repaired with 2-0 chromic via a running suture.  The fascia was reapproximated with a running suture of 0 Vicryl. JP drain was placed. The subcutaneous tissue was reapproximated with 3 interrupted sutures of 2-0 plain.  The skin was reapproximated with a subcuticular suture of 3-0 monocryl.  Skin glue was applied. Jp drain sutured with 2-0 silk.  Instrument, sponge, and needle counts were correct prior to abdominal closure and at the conclusion of the case.  The patient was awaiting transfer to the recovery room in good condition.  Findings: Live female infant with Apgars 8 at one minute and 9 at five minutes.  Normal appearing bilateral ovaries and fallopian tubes were noted.  Estimated Blood Loss:  500 ml         Drains: foley to gravity 30 cc (  pt voided prior to going to OR)         Total IV Fluids: 1500 ml         Specimens to Pathology: Placenta         Complications:  None; patient tolerated the procedure  well.         Disposition: PACU - hemodynamically stable.         Condition: stable  Attending Attestation: I performed the procedure.

## 2013-10-15 NOTE — Lactation Note (Signed)
This note was copied from the chart of [name]. Lactation Consultation Note  Patient Name: [name] ZOXWR'UToday's Date: 10/15/2013 Reason for consult: Other (Comment) (blood sugar 35) Request to assist with breastfeeding due to blood sugar 35. Rn has been assisting mother with breastfeeding and due to infant tongue thrusting and not sustaining latch, RN used a  #24 nipple shield at the breast. Baby sucking intermittently on breast with shield upon arrival to room in football hold. Some colostrum noted in the shield. Baby had mostly slipped off and sleepy. LC assisted mother with latch without shield using breast compression. The breast was massaged throughout the 5 minutes that baby was latch. Baby sucked with strong pulls and maintained the latch before falling asleep. Baby was then placed direct skin to skin on mother's chest, hat on head and back covered with blanket. Dad at bedside for supervision due to Mother reporting being very tired. RN updated. Baby to have serum CBG drawn to assess blood sugar. Mother informed of post-discharge support and given phone number to the lactation department, including services for phone call assistance; out-patient appointments; and breastfeeding support group. List of other breastfeeding resources in the community given in the handout. Encouraged mother to call for problems or concerns related to breastfeeding.  Maternal Data Formula Feeding for Exclusion: No Does the patient have breastfeeding experience prior to this delivery?: No  Feeding Feeding Type: Breast Fed Length of feed: 15 min (per mother, on and off with RN and LC assistance)  LATCH Score/Interventions Latch: Repeated attempts needed to sustain latch, nipple held in mouth throughout feeding, stimulation needed to elicit sucking reflex. Intervention(s): Assist with latch;Breast compression  Audible Swallowing: A few with stimulation (breast massage by LC while baby at breast,  colostrum expressed) Intervention(s): Skin to skin;Hand expression  Type of Nipple: Everted at rest and after stimulation  Comfort (Breast/Nipple): Soft / non-tender     Hold (Positioning): Assistance needed to correctly position infant at breast and maintain latch.  LATCH Score: 7  Lactation Tools Discussed/Used Tools: Other (comment) (RN gave Nipple shield to aid with latch)   Consult Status Consult Status: Follow-up Date: 10/16/13 Follow-up type: In-patient    Christella HartiganDaly, Pablo Mathurin M 10/15/2013, 1:20 PM

## 2013-10-15 NOTE — Anesthesia Postprocedure Evaluation (Signed)
  Anesthesia Post-op Note  Patient: Ricki MillerLauren E Harte  Procedure(s) Performed: Procedure(s): PRIMARY CESAREAN SECTION (N/A)  Patient Location: PACU  Anesthesia Type:Spinal  Level of Consciousness: awake, alert  and oriented  Airway and Oxygen Therapy: Patient Spontanous Breathing  Post-op Pain: none  Post-op Assessment: Post-op Vital signs reviewed, Patient's Cardiovascular Status Stable, Respiratory Function Stable, Patent Airway, No signs of Nausea or vomiting, Pain level controlled, No headache and No backache  Post-op Vital Signs: Reviewed and stable  Last Vitals:  Filed Vitals:   10/15/13 1130  BP: 116/71  Pulse: 60  Temp:   Resp: 14    Complications: No apparent anesthesia complications

## 2013-10-15 NOTE — Progress Notes (Signed)
Addendum Recent Results (from the past 2160 hour(s))  ABO/RH     Status: None   Collection Time    10/14/13 11:25 AM      Result Value Ref Range   ABO/RH(D) A POS    RPR     Status: None   Collection Time    10/14/13 11:39 AM      Result Value Ref Range   RPR NON REAC  NON REAC   Comment: Performed at [facility]  CBC     Status: Abnormal   Collection Time    10/14/13 11:40 AM      Result Value Ref Range   WBC 8.5  4.0 - 10.5 K/uL   RBC 3.70 (*) 3.87 - 5.11 MIL/uL   Hemoglobin 11.7 (*) 12.0 - 15.0 g/dL   HCT 34.9 (*) 36.0 - 46.0 %   MCV 94.3  78.0 - 100.0 fL   MCH 31.6  26.0 - 34.0 pg   MCHC 33.5  30.0 - 36.0 g/dL   RDW 15.0  11.5 - 15.5 %   Platelets 212  150 - 400 K/uL  TYPE AND SCREEN     Status: None   Collection Time    10/14/13 11:40 AM      Result Value Ref Range   ABO/RH(D) A POS     Antibody Screen NEG     Sample Expiration 10/17/2013    PROTEIN / CREATININE RATIO, URINE     Status: Abnormal   Collection Time    10/15/13  9:15 AM      Result Value Ref Range   Creatinine, Urine 119.44     Total Protein, Urine 46.8     Comment: NO NORMAL RANGE ESTABLISHED FOR THIS TEST   PROTEIN CREATININE RATIO 0.39 (*) 0.00 - 0.15  COMPREHENSIVE METABOLIC PANEL     Status: Abnormal   Collection Time    10/15/13 12:39 PM      Result Value Ref Range   Sodium 135 (*) 137 - 147 mEq/L   Potassium 4.3  3.7 - 5.3 mEq/L   Chloride 103  96 - 112 mEq/L   CO2 21  19 - 32 mEq/L   Glucose, Bld 77  70 - 99 mg/dL   BUN 8  6 - 23 mg/dL   Creatinine, Ser 0.69  0.50 - 1.10 mg/dL   Calcium 8.9  8.4 - 10.5 mg/dL   Total Protein 6.1  6.0 - 8.3 g/dL   Albumin 2.3 (*) 3.5 - 5.2 g/dL   AST 14  0 - 37 U/L   ALT 8  0 - 35 U/L   Alkaline Phosphatase 135 (*) 39 - 117 U/L   Total Bilirubin <0.2 (*) 0.3 - 1.2 mg/dL   GFR calc non Af Amer >90  >90 mL/min   GFR calc Af Amer >90  >90 mL/min   Comment: (NOTE)     The eGFR has been calculated using the CKD EPI equation.     This  calculation has not been validated in all clinical situations.     eGFR's persistently <90 mL/min signify possible Chronic Kidney     Disease.   Anion gap 11  5 - 15  LACTATE DEHYDROGENASE     Status: None   Collection Time    10/15/13 12:39 PM      Result Value Ref Range   LDH 192  94 - 250 U/L  URIC ACID     Status: None   Collection Time  10/15/13 12:39 PM      Result Value Ref Range   Uric Acid, Serum 6.3  2.4 - 7.0 mg/dL  MAGNESIUM     Status: None   Collection Time    10/15/13  7:40 PM      Result Value Ref Range   Magnesium 2.0  1.5 - 2.5 mg/dL    Plan of care called to [place] 4g bolus with a 2g continous infusion Mag level 6 hours after mag started Nurse to call if the level is <4.5 or > 5.5 OK to transfer pt to ICU for [place] MSN

## 2013-10-15 NOTE — Progress Notes (Signed)
States itching is better

## 2013-10-15 NOTE — Progress Notes (Signed)
Magnesium bolus started.

## 2013-10-16 LAB — CBC
HCT: 32.8 % — ABNORMAL LOW (ref 36.0–46.0)
HEMOGLOBIN: 10.9 g/dL — AB (ref 12.0–15.0)
MCH: 31.3 pg (ref 26.0–34.0)
MCHC: 33.2 g/dL (ref 30.0–36.0)
MCV: 94.3 fL (ref 78.0–100.0)
PLATELETS: 201 10*3/uL (ref 150–400)
RBC: 3.48 MIL/uL — ABNORMAL LOW (ref 3.87–5.11)
RDW: 15 % (ref 11.5–15.5)
WBC: 11.7 10*3/uL — ABNORMAL HIGH (ref 4.0–10.5)

## 2013-10-16 LAB — URIC ACID: Uric Acid, Serum: 7 mg/dL (ref 2.4–7.0)

## 2013-10-16 LAB — COMPREHENSIVE METABOLIC PANEL
ALT: 9 U/L (ref 0–35)
AST: 17 U/L (ref 0–37)
Albumin: 2.1 g/dL — ABNORMAL LOW (ref 3.5–5.2)
Alkaline Phosphatase: 123 U/L — ABNORMAL HIGH (ref 39–117)
Anion gap: 14 (ref 5–15)
BUN: 9 mg/dL (ref 6–23)
CO2: 20 meq/L (ref 19–32)
Calcium: 8.3 mg/dL — ABNORMAL LOW (ref 8.4–10.5)
Chloride: 103 mEq/L (ref 96–112)
Creatinine, Ser: 0.81 mg/dL (ref 0.50–1.10)
GLUCOSE: 94 mg/dL (ref 70–99)
POTASSIUM: 4.3 meq/L (ref 3.7–5.3)
Sodium: 137 mEq/L (ref 137–147)
Total Bilirubin: <0.2 mg/dL — ABNORMAL LOW (ref 0.3–1.2)
Total Protein: 5.9 g/dL — ABNORMAL LOW (ref 6.0–8.3)

## 2013-10-16 LAB — MAGNESIUM
Magnesium: 4.8 mg/dL — ABNORMAL HIGH (ref 1.5–2.5)
Magnesium: 5.7 mg/dL — ABNORMAL HIGH (ref 1.5–2.5)

## 2013-10-16 LAB — LACTATE DEHYDROGENASE: LDH: 209 U/L (ref 94–250)

## 2013-10-16 MED ORDER — FERROUS SULFATE 325 (65 FE) MG PO TABS
325.0000 mg | ORAL_TABLET | Freq: Two times a day (BID) | ORAL | Status: DC
  Administered 2013-10-16 – 2013-10-18 (×5): 325 mg via ORAL
  Filled 2013-10-16 (×5): qty 1

## 2013-10-16 NOTE — Progress Notes (Signed)
Subjective: Postop Day 1: Cesarean Delivery No complaints.  Pain controlled.  Lochia normal.  Breast feeding yes, S/p lactation consult. Denies SOB but having blurry vision.  States she can not read her smart phone, difficulty focusing  Objective: Temp:  [97.7 F (36.5 C)-98.6 F (37 C)] 97.7 F (36.5 C) (08/15 0851) Pulse Rate:  [60-115] 80 (08/15 0851) Resp:  [12-18] 18 (08/15 0851) BP: (110-143)/(61-86) 123/72 mmHg (08/15 0851) SpO2:  [96 %-100 %] 98 % (08/14 2010) Weight:  [101.606 kg (224 lb)] 101.606 kg (224 lb) (08/14 1230) JP drain:  20/20/7 UOP 1000/625/250 in hat currently  Physical Exam: Gen: NAD Lochia: Not visualized Uterine Fundus: firm, appropriately tender Incision: clean, dry and intact, healing well DVT Evaluation: 3+ Edema present, no calf tenderness bilaterally DTR 2-3+DTR    Recent Labs  10/14/13 1140 10/16/13 0251  HGB 11.7* 10.9*  HCT 34.9* 32.8*  Magnesium 4.8  Assessment/Plan: Status post C-section-doing well postoperatively. Preeclampsia on Magnesium sulfate for seizure prophylaxis.  Sign of magnesium toxicity. BPs normal.  Check Magnesium level.  Discontinue or reduce magnesium pending result. D/w CNM who will f/u on lab.  Discontinue JP drain in am.   Amber Nguyen 10/16/2013, 11:06 AM

## 2013-10-16 NOTE — Anesthesia Postprocedure Evaluation (Signed)
Anesthesia Post Note  Patient: Ricki MillerLauren E Siegert  Procedure(s) Performed: Procedure(s) (LRB): PRIMARY CESAREAN SECTION (N/A)  Anesthesia type: Spinal  Patient location: Mother/Baby  Post pain: Pain level controlled  Post assessment: Post-op Vital signs reviewed  Last Vitals:  Filed Vitals:   10/16/13 0656  BP:   Pulse:   Temp:   Resp: 18    Post vital signs: Reviewed  Level of consciousness:alert  Complications: No apparent anesthesia complications

## 2013-10-16 NOTE — Progress Notes (Addendum)
Subjective: Postpartum Day 1: Cesarean Delivery due to breech Boy "Mason" Patient up ad lib, reports no syncope or dizziness. Feeding:  breast Contraceptive plan:  Unsure will dicuss  Objective: Vital signs in last 24 hours: Temp:  [97.7 F (36.5 C)-98.6 F (37 C)] 97.7 F (36.5 C) (08/15 0552) Pulse Rate:  [60-115] 75 (08/15 0552) Resp:  [12-18] 18 (08/15 0656) BP: (110-143)/(52-86) 127/72 mmHg (08/15 0552) SpO2:  [96 %-100 %] 98 % (08/14 2010) Weight:  [224 lb (101.606 kg)] 224 lb (101.606 kg) (08/14 1230)  Filed Vitals:   10/16/13 0504 10/16/13 0552 10/16/13 0656 10/16/13 0851  BP: 122/61 127/72  123/72  Pulse: 74 75  80  Temp:  97.7 F (36.5 C)  97.7 F (36.5 C)  TempSrc:  Oral  Oral  Resp: 18 18 18 18   Height:      Weight:      SpO2:       Physical Exam:  General: alert and cooperative Lochia: appropriate Uterine Fundus: firm Abdomen:  + bowel sounds, non distended Incision: no significant drainage  Honeycomb dressing CDI DVT Evaluation: No evidence of DVT seen on physical exam. Homan's sign: Negative   Recent Labs  10/14/13 1140 10/16/13 0251  HGB 11.7* 10.9*  HCT 34.9* 32.8*  WBC 8.5 11.7*    Assessment/Plan: Status post Cesarean section day 1. 24 hours Postpartum mag at 2gm/hr, to be Dc's tonight at 9:30pm  Denies HA, SOB, weakness or dizziness Doing well postoperatively.  Continue current care. Breastfeeding and Circumcision prior to discharge Honeycomb dressing in place, no significant drainage JP drain:   yes Anemia - hemodynamicly stable.  Iron supplement  Bid DC foley per protocol Pt ok to shower is stable   Dyamon Sosinski, CNM, MSN 10/16/2013. 9:07 AM

## 2013-10-16 NOTE — Progress Notes (Signed)
Addendum Recent Results (from the past 2160 hour(s))  ABO/RH     Status: None   Collection Time    10/14/13 11:25 AM      Result Value Ref Range   ABO/RH(D) A POS    RPR     Status: None   Collection Time    10/14/13 11:39 AM      Result Value Ref Range   RPR NON REAC  NON REAC   Comment: Performed at [facility]  CBC     Status: Abnormal   Collection Time    10/14/13 11:40 AM      Result Value Ref Range   WBC 8.5  4.0 - 10.5 K/uL   RBC 3.70 (*) 3.87 - 5.11 MIL/uL   Hemoglobin 11.7 (*) 12.0 - 15.0 g/dL   HCT 34.9 (*) 36.0 - 46.0 %   MCV 94.3  78.0 - 100.0 fL   MCH 31.6  26.0 - 34.0 pg   MCHC 33.5  30.0 - 36.0 g/dL   RDW 15.0  11.5 - 15.5 %   Platelets 212  150 - 400 K/uL  TYPE AND SCREEN     Status: None   Collection Time    10/14/13 11:40 AM      Result Value Ref Range   ABO/RH(D) A POS     Antibody Screen NEG     Sample Expiration 10/17/2013    PROTEIN / CREATININE RATIO, URINE     Status: Abnormal   Collection Time    10/15/13  9:15 AM      Result Value Ref Range   Creatinine, Urine 119.44     Total Protein, Urine 46.8     Comment: NO NORMAL RANGE ESTABLISHED FOR THIS TEST   PROTEIN CREATININE RATIO 0.39 (*) 0.00 - 0.15  COMPREHENSIVE METABOLIC PANEL     Status: Abnormal   Collection Time    10/15/13 12:39 PM      Result Value Ref Range   Sodium 135 (*) 137 - 147 mEq/L   Potassium 4.3  3.7 - 5.3 mEq/L   Chloride 103  96 - 112 mEq/L   CO2 21  19 - 32 mEq/L   Glucose, Bld 77  70 - 99 mg/dL   BUN 8  6 - 23 mg/dL   Creatinine, Ser 0.69  0.50 - 1.10 mg/dL   Calcium 8.9  8.4 - 10.5 mg/dL   Total Protein 6.1  6.0 - 8.3 g/dL   Albumin 2.3 (*) 3.5 - 5.2 g/dL   AST 14  0 - 37 U/L   ALT 8  0 - 35 U/L   Alkaline Phosphatase 135 (*) 39 - 117 U/L   Total Bilirubin <0.2 (*) 0.3 - 1.2 mg/dL   GFR calc non Af Amer >90  >90 mL/min   GFR calc Af Amer >90  >90 mL/min   Comment: (NOTE)     The eGFR has been calculated using the CKD EPI equation.     This  calculation has not been validated in all clinical situations.     eGFR's persistently <90 mL/min signify possible Chronic Kidney     Disease.   Anion gap 11  5 - 15  LACTATE DEHYDROGENASE     Status: None   Collection Time    10/15/13 12:39 PM      Result Value Ref Range   LDH 192  94 - 250 U/L  URIC ACID     Status: None   Collection Time  10/15/13 12:39 PM      Result Value Ref Range   Uric Acid, Serum 6.3  2.4 - 7.0 mg/dL  MAGNESIUM     Status: None   Collection Time    10/15/13  7:40 PM      Result Value Ref Range   Magnesium 2.0  1.5 - 2.5 mg/dL  LACTATE DEHYDROGENASE     Status: None   Collection Time    10/16/13  2:51 AM      Result Value Ref Range   LDH 209  94 - 250 U/L  URIC ACID     Status: None   Collection Time    10/16/13  2:51 AM      Result Value Ref Range   Uric Acid, Serum 7.0  2.4 - 7.0 mg/dL  MAGNESIUM     Status: Abnormal   Collection Time    10/16/13  2:51 AM      Result Value Ref Range   Magnesium 4.8 (*) 1.5 - 2.5 mg/dL  COMPREHENSIVE METABOLIC PANEL     Status: Abnormal   Collection Time    10/16/13  2:51 AM      Result Value Ref Range   Sodium 137  137 - 147 mEq/L   Potassium 4.3  3.7 - 5.3 mEq/L   Chloride 103  96 - 112 mEq/L   CO2 20  19 - 32 mEq/L   Glucose, Bld 94  70 - 99 mg/dL   BUN 9  6 - 23 mg/dL   Creatinine, Ser 0.81  0.50 - 1.10 mg/dL   Calcium 8.3 (*) 8.4 - 10.5 mg/dL   Total Protein 5.9 (*) 6.0 - 8.3 g/dL   Albumin 2.1 (*) 3.5 - 5.2 g/dL   AST 17  0 - 37 U/L   ALT 9  0 - 35 U/L   Alkaline Phosphatase 123 (*) 39 - 117 U/L   Total Bilirubin <0.2 (*) 0.3 - 1.2 mg/dL   GFR calc non Af Amer >90  >90 mL/min   GFR calc Af Amer >90  >90 mL/min   Comment: (NOTE)     The eGFR has been calculated using the CKD EPI equation.     This calculation has not been validated in all clinical situations.     eGFR's persistently <90 mL/min signify possible Chronic Kidney     Disease.   Anion gap 14  5 - 15  CBC     Status: Abnormal    Collection Time    10/16/13  2:51 AM      Result Value Ref Range   WBC 11.7 (*) 4.0 - 10.5 K/uL   RBC 3.48 (*) 3.87 - 5.11 MIL/uL   Hemoglobin 10.9 (*) 12.0 - 15.0 g/dL   HCT 32.8 (*) 36.0 - 46.0 %   MCV 94.3  78.0 - 100.0 fL   MCH 31.3  26.0 - 34.0 pg   MCHC 33.2  30.0 - 36.0 g/dL   RDW 15.0  11.5 - 15.5 %   Platelets 201  150 - 400 K/uL  MAGNESIUM     Status: Abnormal   Collection Time    10/16/13 11:11 AM      Result Value Ref Range   Magnesium 5.7 (*) 1.5 - 2.5 mg/dL   Comment: RESULT CONFIRMED BY AUTOMATED DILUTION   Filed Vitals:   10/16/13 0552 10/16/13 0656 10/16/13 0851 10/16/13 1317  BP: 127/72  123/72 117/69  Pulse: 75  80 76  Temp: 97.7 F (36.5  C)  97.7 F (36.5 C) 98.2 F (36.8 C)  TempSrc: Oral  Oral Oral  Resp: _0 Height:      Weight:      SpO2:       Decrease Mag to 1gm/hr Access in 2 hours if pt continue to c/o blurry vision DC mag, per Dr Simona Huh

## 2013-10-16 NOTE — Addendum Note (Signed)
Addendum created 10/16/13 0915 by Lincoln BrighamAngela Draughon Sylena Lotter, CRNA   Modules edited: Notes Section   Notes Section:  File: [id number]

## 2013-10-16 NOTE — Lactation Note (Signed)
This note was copied from the chart of Amber Heinz KnucklesLauren Sorci. Lactation Consultation Note   Follow up consult with this mom and baby, now 24 hours old, and 37 2/[redacted] weeks gestation.   The baby has voided 4 times and stooled once in first 24 hours, and has lost 4% , some of this related to frequent voids. I gave mom and dad the information on LPT infants, even though their baby si an early ter. He is small, now 6 lbs 2 ounces, and sleepy. i set mom up with a DEP, and advised that her goal is to pump every 3 hours, and to supplement the baby with whatever she expresses. I showed mom how to hand express, and advised that she add this after pumping, to increase her supply.  Mom 's breast are edematous, but she has easily expressed colostrum. The baby fed well this feed, for 10 minutes. Mom knows to call for questions/concerns. Skin to skin as much as possible encouraged also.  Patient Name: Amber Nguyen ZOXWR'UToday's Date: 10/16/2013 Reason for consult: Follow-up assessment   Maternal Data    Feeding Feeding Type: Breast Fed Length of feed: 15 min  LATCH Score/Interventions Latch: Repeated attempts needed to sustain latch, nipple held in mouth throughout feeding, stimulation needed to elicit sucking reflex. Intervention(s): Adjust position;Assist with latch;Breast compression  Audible Swallowing: A few with stimulation Intervention(s): Skin to skin;Hand expression Intervention(s): Skin to skin;Hand expression  Type of Nipple: Flat Intervention(s): Double electric pump;Hand pump  Comfort (Breast/Nipple): Soft / non-tender     Hold (Positioning): Assistance needed to correctly position infant at breast and maintain latch. Intervention(s): Breastfeeding basics reviewed;Support Pillows;Position options;Skin to skin  LATCH Score: 6  Lactation Tools Discussed/Used Tools: Nipple Shields Nipple shield size: 24;Other (comment) (initial latch - shiled big for baby, abut he adjusted and latched deeply  - colostrum seen in shiled at end of feeding) Pump Review: Setup, frequency, and cleaning;Milk Storage;Other (comment) (premie setting, hand exp, how to obtaina deep latch with nipploe shiled) Initiated by:: clee rn lc Date initiated:: 10/16/13   Consult Status Consult Status: Follow-up Date: 10/17/13 Follow-up type: In-patient    Alfred LevinsLee, Akeia Perot Anne 10/16/2013, 10:16 AM

## 2013-10-16 NOTE — Lactation Note (Signed)
This note was copied from the chart of Amber Nguyen. Lactation Consultation Note    Follow up consult with this mom and baby, now 4332 hours old, and 37 3/7 weeks CGA. The baby is now more awake, and is crying as if hungry. i assisted mom with hand expression, and got a drop of colostrum, which mom fed to the baby. Mom is just now coming of her mag drip. I reweighed the baby, and he is now at 7.7 % weight loss. He is voiding well, but only 1 stool so far. Mom and dad agreed to supplementing baby with formula. 24 calorie Pregestimil is being used, volumes  according to the LPT care plan, which was given to the parents. Mom pumped once, and did not get anything, so has not pumped again. I again reviewed with her the importance of pumping every 3 hours, followed by hand expression. i advised mom that she could still put the baby to breast, every 3 hours, ut limit his feed at the breast to 15 minutes, and then supplement with up to 20 mls of formula. Mom very emotional, but parents agree with the plan. I assured mom she has done nothing wrong, and that tomorrow will be a different day, and when her milk begins to transition in, we will use her EBM to supplement, instead of formula. Dr Earlene PlaterWallace, covering for Dr. Donnie Coffinubin, was called and updated on the above.  Patient Name: Amber Nguyen ZOXWR'UToday's Date: 10/16/2013 Reason for consult: Follow-up assessment   Maternal Data    Feeding Feeding Type: Breast Fed Length of feed: 15 min  LATCH Score/Interventions Latch: Repeated attempts needed to sustain latch, nipple held in mouth throughout feeding, stimulation needed to elicit sucking reflex. Intervention(s): Adjust position;Assist with latch;Breast compression  Audible Swallowing: None Intervention(s): Skin to skin;Hand expression Intervention(s): Skin to skin;Hand expression  Type of Nipple: Flat Intervention(s): Hand pump;Double electric pump  Comfort (Breast/Nipple): Soft / non-tender     Hold  (Positioning): Assistance needed to correctly position infant at breast and maintain latch.  LATCH Score: 5  Lactation Tools Discussed/Used     Consult Status Consult Status: Follow-up Date: 10/17/13 Follow-up type: In-patient    Alfred LevinsLee, Erie Radu Anne 10/16/2013, 5:29 PM

## 2013-10-17 DIAGNOSIS — IMO0002 Reserved for concepts with insufficient information to code with codable children: Secondary | ICD-10-CM | POA: Diagnosis present

## 2013-10-17 NOTE — Progress Notes (Signed)
Removed saline lock from L hand catheter intact. Applied small 2x2 dressing

## 2013-10-17 NOTE — Progress Notes (Signed)
Subjective: Postpartum Day 2: Cesarean Delivery due to previous breech presentation, vtx at delivery, cholestasis, pre-eclampsia--magnesium administered x 20 hours.  D/C'd early due to blurry vision. Patient up ad lib, reports no syncope or dizziness.   Feeding:  Breast and supplementation per peds plan due to infant weight loss.  Patient feeling more settled about that plan today.  "Feeling less guilty". Contraceptive plan:  Undecided--options discussed yesterday by CNM, no decision yet.  Very pleased that itching from cholestasis has resolved.  Objective: Vital signs in last 24 hours: Temp:  [98 F (36.7 C)-98.8 F (37.1 C)] 98.4 F (36.9 C) (08/16 0900) Pulse Rate:  [76-126] 89 (08/16 0900) Resp:  [18-20] 18 (08/16 0900) BP: (111-149)/(60-96) 128/68 mmHg (08/16 0900) SpO2:  [98 %-99 %] 98 % (08/16 0503)  Filed Vitals:   10/16/13 2305 10/17/13 0154 10/17/13 0503 10/17/13 0900  BP: 111/60 149/96 122/66 128/68  Pulse: 81 126 99 89  Temp: 98.5 F (36.9 C) 98 F (36.7 C) 98.2 F (36.8 C) 98.4 F (36.9 C)  TempSrc: Oral Oral Oral Oral  Resp: 18 20 18 18   Height:      Weight:      SpO2: 98%  98%     Physical Exam:  General: alert Lochia: appropriate Uterine Fundus: firm Abdomen:  + bowel sounds Incision: Honeycomb dressing CDI DVT Evaluation: No evidence of DVT seen on physical exam. Negative Homan's sign. JP drain:   12 cc overnight, serosanguinous    Recent Labs  10/14/13 1140 10/16/13 0251  HGB 11.7* 10.9*  HCT 34.9* 32.8*  WBC 8.5 11.7*    Assessment/Plan: Status post Cesarean section day 2--previous breech, vtx at delivery. Pre-eclampsia--resolving Cholestasis--resolved Doing well postoperatively.  Continue current care. Plan for discharge tomorrow    Nigel BridgemanLATHAM, Melvina Pangelinan CNM, [place] 10/17/2013, 10:10 AM

## 2013-10-17 NOTE — Lactation Note (Signed)
This note was copied from the chart of Boy Heinz KnucklesLauren Sebastiano. Lactation Consultation Note  Patient Name: Boy Heinz KnucklesLauren Erhart ZOXWR'UToday's Date: 10/17/2013 Reason for consult: Follow-up assessment;Infant < 6lbs Baby 62 hours of life. Mom is pumping and getting good amount of colostrum from both breasts. Baby has been receiving supplemental formula. Mom intends to feed EBM at next feeding and supplement with formula as needed. Mom has DEBP at home. Discussed continuing to attempt to latch baby at breast, directly to breast without NS, and using NS as needed. [facility] without [facility].   Maternal Data    Feeding Feeding Type: Formula  LATCH Score/Interventions Latch:  (Baby supplemented with formula. Mom pumping and getting good amount of colostrum from both breast. Will feed EBM and supplement as needed with formula.)                    Lactation Tools Discussed/Used     Consult Status Consult Status: Follow-up Date: 10/18/13 Follow-up type: In-patient    Geralynn OchsWILLIARD, Annita Ratliff 10/17/2013, 11:12 PM

## 2013-10-17 NOTE — Discharge Summary (Signed)
Cesarean Section Delivery Discharge Summary  Amber Nguyen  DOB:    07/17/1985 MRN:    [id number] CSN:    [id number]  Date of admission:                  10/15/13  Date of discharge:                   10/17/13  Procedures this admission:  Primary LTCS for previous breech, vtx at delivery, magnesium sulfate pp for pre-eclampsia  Date of Delivery: 10/15/13  Newborn Data:  Live born female  Birth Weight: 6 lb 6.3 oz (2900 g) APGAR: 8, 9  Home with mother. Name:  Circumcision Plan: Outpatient  History of Present Illness:  Ms. Amber Nguyen is a 28 y.o. female, G1P1001, who presents at 7746w2d weeks gestation. [facility]    Her pregnancy has been complicated by:  Patient Active Problem List   Diagnosis Date Noted  . Mild or unspecified pre-eclampsia, with delivery 10/17/2013  . S/P primary low transverse C-section 10/15/2013  . Morbid obesity 10/13/2013  . Cholestasis of pregnancy 10/13/2013  . Elevated LFTs 10/13/2013    Hospital Course--Scheduled Cesarean: Patient was admitted on 10/15/13 for a scheduled primary cesarean delivery due to persistent breech presentation.  Upon evaluation, she was noted to be vtx, but patient elected to proceed with scheduled C/S.  She was taken to the operating room, where Dr.  Su Hiltoberts performed a primary LTCS under spinal anesthesia, with delivery of a viable female, with weight and Apgars as listed below. Infant was in good condition and remained at the patient's bedside.  The patient was taken to recovery in good condition.  Patient planned to breast feed.  PCR was done on day of admission, with result of 0.39.  Magnesium sulfate was initiated postdelivery for 20 hours, d/c'd early due to patient visual blurring.  On post-op day 1, patient was doing well, tolerating a regular diet, with Hgb of 10.9 .  Throughout her stay, her physical exam  was WNL, her incision was CDI, and her vital signs remained stable.  By post-op day 3, she was up ad lib, tolerating a regular diet, with good pain control with po med.  She was deemed to have received the full benefit of her hospital stay, and was discharged home in stable condition.  Contraceptive choice was undecided at time of d/c, but options were reviewed prior to d/c.     Feeding:  Breast and supplementation per peds plan  Contraception:  Undecided at d/c  Discharge hemoglobin:  Hemoglobin  Date Value Ref Range Status  10/16/2013 10.9* 12.0 - 15.0 g/dL Final  0/86/57848/13/2015 69.611.7* 12.0 - 15.0 g/dL Final  2/9/52848/06/2008 9.2* 13.212.0 - 15.0 g/dL Final     HCT  Date Value Ref Range Status  10/16/2013 32.8* 36.0 - 46.0 % Final  10/14/2013 34.9* 36.0 - 46.0 % Final  10/05/2008 26.7* 36.0 - 46.0 % Final     WBC  Date Value Ref Range Status  10/16/2013 11.7* 4.0 - 10.5 K/uL Final  10/14/2013 8.5  4.0 - 10.5 K/uL Final  10/05/2008 7.6  4.0 - 10.5 K/uL Final    Discharge Physical Exam:   General: alert Lochia: appropriate Uterine Fundus: firm Abdomen:  + bowel sounds Incision: Honeycomb dressing removed to allow for removal of JP drain. Incision CDI with Dermabond.  New dressing applied. JP  drain with 5 overnight--removed without difficulty DVT Evaluation: No evidence of DVT seen on physical exam. Negative Homan's sign.    Intrapartum Procedures: cesarean: low cervical, transverse due to previous breech, vtx at delivery Postpartum Procedures: Magnesium sulfate x 20 hours Complications-Operative and Postpartum: none  Discharge Diagnoses: Term Pregnancy-delivered, primary LTCS due to previous breech presentation, vtx at delivery, cholestasis, pre-eclampsia  Discharge Information:  Activity:           pelvic rest Diet:                routine Medications: Ibuprofen and Percocet Condition:      stable Instructions:  Discharge to: home  Follow-up Information   Follow up with [facility]. Schedule an appointment as soon as possible for a visit in 6 weeks. (Call for any questions or concerns.)    Specialty:  Obstetrics and Gynecology   Contact information:   [address] [place] [address] [phone]       Nigel Bridgeman [facility] 10/18/2013 8:03 AM

## 2013-10-18 MED ORDER — IBUPROFEN 600 MG PO TABS: 600.0000 mg | ORAL_TABLET | Freq: Four times a day (QID) | ORAL | Status: DC | PRN

## 2013-10-18 MED ORDER — OXYCODONE-ACETAMINOPHEN 5-325 MG PO TABS: 1.0000 | ORAL_TABLET | ORAL | Status: DC | PRN

## 2013-10-18 NOTE — Discharge Instructions (Signed)
Can use over the counter meds for gas--Gas X, Phazyme, Mylanta Gas--as directed on package for gas. Drink lots of fluids, avoid carbonated beverages until gas improves.   Walk at intervals to help move the gas down.  Postpartum Care After Cesarean Delivery After you deliver your newborn (postpartum period), the usual stay in the hospital is 24-72 hours. If there were problems with your labor or delivery, or if you have other medical problems, you might be in the hospital longer.  While you are in the hospital, you will receive help and instructions on how to care for yourself and your newborn during the postpartum period.  While you are in the hospital:  It is normal for you to have pain or discomfort from the incision in your abdomen. Be sure to tell your nurses when you are having pain, where the pain is located, and what makes the pain worse.  If you are breastfeeding, you may feel uncomfortable contractions of your uterus for a couple of weeks. This is normal. The contractions help your uterus get back to normal size.  It is normal to have some bleeding after delivery.  For the first 1-3 days after delivery, the flow is red and the amount may be similar to a period.  It is common for the flow to start and stop.  In the first few days, you may pass some small clots. Let your nurses know if you begin to pass large clots or your flow increases.  Do not  flush blood clots down the toilet before having the nurse look at them.  During the next 3-10 days after delivery, your flow should become more watery and pink or brown-tinged in color.  Ten to fourteen days after delivery, your flow should be a small amount of yellowish-white discharge.  The amount of your flow will decrease over the first few weeks after delivery. Your flow may stop in 6-8 weeks. Most women have had their flow stop by 12 weeks after delivery.  You should change your sanitary pads frequently.  Wash your hands  thoroughly with soap and water for at least 20 seconds after changing pads, using the toilet, or before holding or feeding your newborn.  Your intravenous (IV) tubing will be removed when you are drinking enough fluids.  The urine drainage tube (urinary catheter) that was inserted before delivery may be removed within 6-8 hours after delivery or when feeling returns to your legs. You should feel like you need to empty your bladder within the first 6-8 hours after the catheter has been removed.  In case you become weak, lightheaded, or faint, call your nurse before you get out of bed for the first time and before you take a shower for the first time.  Within the first few days after delivery, your breasts may begin to feel tender and full. This is called engorgement. Breast tenderness usually goes away within 48-72 hours after engorgement occurs. You may also notice milk leaking from your breasts. If you are not breastfeeding, do not stimulate your breasts. Breast stimulation can make your breasts produce more milk.  Spending as much time as possible with your newborn is very important. During this time, you and your newborn can feel close and get to know each other. Having your newborn stay in your room (rooming in) will help to strengthen the bond with your newborn. It will give you time to get to know your newborn and become comfortable caring for your newborn.  Your  hormones change after delivery. Sometimes the hormone changes can temporarily cause you to feel sad or tearful. These feelings should not last more than a few days. If these feelings last longer than that, you should talk to your caregiver.  If desired, talk to your caregiver about methods of family planning or contraception.  Talk to your caregiver about immunizations. Your caregiver may want you to have the following immunizations before leaving the hospital:  Tetanus, diphtheria, and pertussis (Tdap) or tetanus and diphtheria (Td)  immunization. It is very important that you and your family (including grandparents) or others caring for your newborn are up-to-date with the Tdap or Td immunizations. The Tdap or Td immunization can help protect your newborn from getting ill.  Rubella immunization.  Varicella (chickenpox) immunization.  Influenza immunization. You should receive this annual immunization if you did not receive the immunization during your pregnancy. Document Released: 11/13/2011 Document Reviewed: 11/13/2011 [facility] Patient Information 2015 [place], [place]. This information is not intended to replace advice given to you by your health care provider. Make sure you discuss any questions you have with your health care provider.

## 2013-10-18 NOTE — Lactation Note (Signed)
This note was copied from the chart of Amber Heinz KnucklesLauren Berrian. Lactation Consultation Note: Follow up visit with mom before DC. Baby to be circ'd today. Reviewed normal behavior after circ. Mom has been pumping and bottle feeding EBM and formula. Has not pumped yet today. Reports that she pumped 4 times yesterday. Encouraged to pump q 3 hours to promote a good milk supply. Has Medela DEBP at home. Encouraged to call for OP appointment when she is ready to put baby to the breast. No questions at present. To call prn  Patient Name: Amber Heinz KnucklesLauren Osias ZOXWR'UToday's Date: 10/18/2013 Reason for consult: Follow-up assessment   Maternal Data Formula Feeding for Exclusion: No  Feeding Feeding Type: Bottle Fed - Formula  LATCH Score/Interventions                      Lactation Tools Discussed/Used WIC Program: No   Consult Status Consult Status: Complete    Pamelia HoitWeeks, Senie Lanese D 10/18/2013, 9:45 AM

## 2013-10-19 ENCOUNTER — Encounter (HOSPITAL_COMMUNITY): Payer: Self-pay | Admitting: Obstetrics and Gynecology

## 2013-11-03 ENCOUNTER — Encounter (HOSPITAL_COMMUNITY): Payer: Self-pay | Admitting: *Deleted

## 2013-11-03 ENCOUNTER — Inpatient Hospital Stay (HOSPITAL_COMMUNITY)
Admission: AD | Admit: 2013-11-03 | Discharge: 2013-11-03 | Disposition: A | Discharge status: Home / Self Care | Payer: BC Managed Care – PPO | Source: Ambulatory Visit | Attending: Obstetrics and Gynecology | Admitting: Obstetrics and Gynecology

## 2013-11-03 DIAGNOSIS — O9122 Nonpurulent mastitis associated with the puerperium: Secondary | ICD-10-CM | POA: Insufficient documentation

## 2013-11-03 DIAGNOSIS — O864 Pyrexia of unknown origin following delivery: Secondary | ICD-10-CM | POA: Insufficient documentation

## 2013-11-03 LAB — CBC WITH DIFFERENTIAL/PLATELET
Basophils Absolute: 0 10*3/uL (ref 0.0–0.1)
Basophils Relative: 0 % (ref 0–1)
Eosinophils Absolute: 0.1 10*3/uL (ref 0.0–0.7)
Eosinophils Relative: 1 % (ref 0–5)
HCT: 38.5 % (ref 36.0–46.0)
HEMOGLOBIN: 12.7 g/dL (ref 12.0–15.0)
LYMPHS PCT: 9 % — AB (ref 12–46)
Lymphs Abs: 1 10*3/uL (ref 0.7–4.0)
MCH: 31.1 pg (ref 26.0–34.0)
MCHC: 33 g/dL (ref 30.0–36.0)
MCV: 94.1 fL (ref 78.0–100.0)
MONOS PCT: 4 % (ref 3–12)
Monocytes Absolute: 0.4 10*3/uL (ref 0.1–1.0)
Neutro Abs: 9.3 10*3/uL — ABNORMAL HIGH (ref 1.7–7.7)
Neutrophils Relative %: 86 % — ABNORMAL HIGH (ref 43–77)
Platelets: 277 10*3/uL (ref 150–400)
RBC: 4.09 MIL/uL (ref 3.87–5.11)
RDW: 14.5 % (ref 11.5–15.5)
WBC: 10.8 10*3/uL — AB (ref 4.0–10.5)

## 2013-11-03 LAB — COMPREHENSIVE METABOLIC PANEL
ALK PHOS: 116 U/L (ref 39–117)
ALT: 16 U/L (ref 0–35)
ANION GAP: 13 (ref 5–15)
AST: 12 U/L (ref 0–37)
Albumin: 3.7 g/dL (ref 3.5–5.2)
BUN: 6 mg/dL (ref 6–23)
CHLORIDE: 101 meq/L (ref 96–112)
CO2: 22 meq/L (ref 19–32)
Calcium: 9.2 mg/dL (ref 8.4–10.5)
Creatinine, Ser: 0.97 mg/dL (ref 0.50–1.10)
GFR calc non Af Amer: 79 mL/min — ABNORMAL LOW (ref >90)
Glucose, Bld: 96 mg/dL (ref 70–99)
POTASSIUM: 4.1 meq/L (ref 3.7–5.3)
SODIUM: 136 meq/L — AB (ref 137–147)
Total Bilirubin: 0.4 mg/dL (ref 0.3–1.2)
Total Protein: 7.5 g/dL (ref 6.0–8.3)

## 2013-11-03 LAB — URINALYSIS, ROUTINE W REFLEX MICROSCOPIC
BILIRUBIN URINE: NEGATIVE
Glucose, UA: NEGATIVE mg/dL
Ketones, ur: NEGATIVE mg/dL
Leukocytes, UA: NEGATIVE
NITRITE: NEGATIVE
PH: 6 (ref 5.0–8.0)
Protein, ur: NEGATIVE mg/dL
SPECIFIC GRAVITY, URINE: 1.01 (ref 1.005–1.030)
Urobilinogen, UA: 0.2 mg/dL (ref 0.0–1.0)

## 2013-11-03 LAB — URINE MICROSCOPIC-ADD ON

## 2013-11-03 MED ORDER — ACETAMINOPHEN 325 MG PO TABS
650.0000 mg | ORAL_TABLET | ORAL | Status: DC | PRN
  Administered 2013-11-03: 650 mg via ORAL
  Filled 2013-11-03: qty 2

## 2013-11-03 MED ORDER — CEFAZOLIN SODIUM-DEXTROSE 2-3 GM-% IV SOLR
2.0000 g | Freq: Once | INTRAVENOUS | Status: AC
  Administered 2013-11-03: 2 g via INTRAVENOUS
  Filled 2013-11-03: qty 50

## 2013-11-03 MED ORDER — LACTATED RINGERS IV SOLN: INTRAVENOUS | Status: DC

## 2013-11-03 MED ORDER — LACTATED RINGERS IV BOLUS (SEPSIS)
500.0000 mL | Freq: Once | INTRAVENOUS | Status: AC
  Administered 2013-11-03: 500 mL via INTRAVENOUS

## 2013-11-03 MED ORDER — CEPHALEXIN 500 MG PO CAPS: 500.0000 mg | ORAL_CAPSULE | Freq: Four times a day (QID) | ORAL | Status: AC

## 2013-11-03 NOTE — MAU Provider Note (Signed)
History   28 yo G1P1 s/p primary LTCS 8/14 for previous breech (vtx on day of surgery, patient elected to proceed with C/S), cholestasis of pregnancy.  GBS negative, presenting from the office with fever for further evaluation.  She reports fever, body aches, and malaise x 24 hours, with pain in right breast, but no redness or swelling.  She was seen in the office late today as a work-in for pp fever, with temp of 101.8 there.  No evidence of mastitis was noted, but she was having some pain in right breast.  Breast milk cultures were done.  No other evaluation was completed in the office.  Dx with pre-eclampsia early pp period, with magnesium sulfate for approx 20 hrs (d/c'd early due to visual blurring).  BPs remained stable during hospitalization, and she had no s/s of wound issues during her stay.  She was breastfeeding and supplementing per pediatrician recommendation due to baby's weight loss.  Home on Labetalol 100 mg po BID.   Patient Active Problem List   Diagnosis Date Noted  . Postpartum fever 11/03/2013  . Mild or unspecified pre-eclampsia, with delivery 10/17/2013  . S/P primary low transverse C-section 10/15/2013  . Morbid obesity 10/13/2013  . Cholestasis of pregnancy 10/13/2013  . Elevated LFTs 10/13/2013    Chief Complaint  Patient presents with  . Fever  . Chills   HPI:  As above  OB History   Grav Para Term Preterm Abortions TAB SAB Ect Mult Living   0 0 0 0 0 0 1      Past Medical History  Diagnosis Date  . 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 Cholecystitis   . Hypertension     PIH  . Anxiety     situational  . Heartburn during pregnancy     Past Surgical History  Procedure Laterality Date  . Tubes in ears    . Fracture surgery      left wrist  . Tendon repair      right hand  . Cesarean section N/A 10/15/2013    Procedure: PRIMARY CESAREAN SECTION;  Surgeon: Purcell Nails, MD;  Location: [facility];  Service: Obstetrics;  Laterality: N/A;     Family History  Problem Relation Age of Onset  . Thyroid disease Mother   . Hypertension Father   . Diabetes Paternal Grandmother   . Diabetes Paternal Grandfather   . Cancer Maternal Grandmother     lung    History  Substance Use Topics  . Smoking status: Never Smoker   . Smokeless tobacco: Not on file  . Alcohol Use: No    Allergies: No Known Allergies  Prescriptions prior to admission  Medication Sig Dispense Refill  . ibuprofen (ADVIL,MOTRIN) 600 MG tablet Take 1 tablet (600 mg total) by mouth every 6 (six) hours as needed for mild pain.  30 tablet  0  . oxyCODONE-acetaminophen (PERCOCET/ROXICET) 5-325 MG per tablet Take 1-2 tablets by mouth every 4 (four) hours as needed for severe pain (moderate - severe pain).  30 tablet  0  . Prenatal Vit-Fe Sulfate-FA (PRENATAL VITAMIN PO) Take by mouth daily.        ROS:  Fever, body aches, right breast pain. Physical Exam   Blood pressure 125/71, pulse 104, temperature 100.6 F (38.1 C), temperature source Oral, resp. rate 20, unknown if currently breastfeeding.  Filed Vitals:   11/03/13 1730  BP: 125/71  Pulse: 104  Temp: 100.6 F (38.1 C)  TempSrc: Oral  Resp: 20   Temp 99.6 at 7p--no antipyretics given, last Tylenol around 10am today.  Physical Exam Nasal turbinates clear Throat clear Ears--+ cerumen, no erythema or pain on palpation Chest clear Heart RRR without murmur Negative CVAT Breast--right breast with approx 5 cm circumscribed area of erythema on inner aspect Abd--LTCS incision CDI, no drainage or erythema Pelvic--uterus well-involuted, mild tenderness on right side of uterus, no bleeding Ext WNL, no edema.    ED Course  Assessment: 2 1/2 weeks post-op C/S Postpartum fever--likely mastitis Hx pp pre-eclampsia--on Labetalol 100 mg po BID Normotensive  Plan: Initial orders per Dr. Su Hilt s/p office evaluation. Flu PCR testing. Tylenol 650 mg po. CBC, diff, CMP UA, urine culture IV  hydration Ancef 2 gm IV. Consulted with Dr. Normand Sloop after patient assessment. Anticipate d/c home after IV hydration and Ancef.   Nigel Bridgeman CNM, MSN 11/03/2013 6:19 PM  Addendum: Completed 1 liter IV hydration and Ancef 2 gm dose. Feeling better. Flu swab still pending.  Presumptive Dx: Mastitis  Filed Vitals:   11/03/13 1730 11/03/13 2115  BP: 125/71 115/61  Pulse: 104 74  Temp: 100.6 F (38.1 C) 98.3 F (36.8 C)  TempSrc: Oral Oral  Resp: 20 18   Rx Keflex 500 mg po QID x 7 days. Stop Labetalol. F/u in 1 week at [facility], keep planned pp exam visit 11/25/13. To call with any worsening sx.  Nigel Bridgeman CNM, MSN 11/03/13 2130

## 2013-11-03 NOTE — MAU Note (Signed)
Pt at MD office for fever chills and body aches. Also c/o right breast pain. Sent from office for further eval

## 2013-11-03 NOTE — Discharge Instructions (Signed)
Stop Labetalol. Take tylenol every 4 hours for next 24 hours, then as needed.  Mastitis Mastitis is inflammation of the breast tissue. It occurs most often in women who are breastfeeding, but it can also affect other women, and even sometimes men. CAUSES  Mastitis is usually caused by a bacterial infection. Bacteria enter the breast tissue through cuts or openings in the skin. Typically, this occurs with breastfeeding because of cracked or irritated skin. Sometimes, it can occur even when there is no opening in the skin. It can be associated with plugged milk (lactiferous) ducts. Nipple piercing can also lead to mastitis. Also, some forms of breast cancer can cause mastitis. SIGNS AND SYMPTOMS   Swelling, redness, tenderness, and pain in an area of the breast.  Swelling of the glands under the arm on the same side.  Fever. If an infection is allowed to progress, a collection of pus (abscess) may develop. DIAGNOSIS  Your health care provider can usually diagnose mastitis based on your symptoms and a physical exam. Tests may be done to help confirm the diagnosis. These may include:   Removal of pus from the breast by applying pressure to the area. This pus can be examined in the lab to determine which bacteria are present. If an abscess has developed, the fluid in the abscess can be removed with a needle. This can also be used to confirm the diagnosis and determine the bacteria present. In most cases, pus will not be present.  Blood tests to determine if your body is fighting a bacterial infection.  Mammogram or ultrasound tests to rule out other problems or diseases. TREATMENT  Antibiotic medicine is used to treat a bacterial infection. Your health care provider will determine which bacteria are most likely causing the infection and will select an appropriate antibiotic. This is sometimes changed based on the results of tests performed to identify the bacteria, or if there is no response to  the antibiotic selected. Antibiotics are usually given by mouth. You may also be given medicine for pain. Mastitis that occurs with breastfeeding will sometimes go away on its own, so your health care provider may choose to wait 24 hours after first seeing you to decide whether a prescription medicine is needed. HOME CARE INSTRUCTIONS   Only take over-the-counter or prescription medicines for pain, fever, or discomfort as directed by your health care provider.  If your health care provider prescribed an antibiotic, take the medicine as directed. Make sure you finish it even if you start to feel better.  Do not wear a tight or underwire bra. Wear a soft, supportive bra.  Increase your fluid intake, especially if you have a fever.  Women who are breastfeeding should follow these instructions:  Continue to empty the breast. Your health care provider can tell you whether this milk is safe for your infant or needs to be thrown out. You may be told to stop nursing until your health care provider thinks it is safe for your baby. Use a breast pump if you are advised to stop nursing.  Keep your nipples clean and dry.  Empty the first breast completely before going to the other breast. If your baby is not emptying your breasts completely for some reason, use a breast pump to empty your breasts.  If you go back to work, pump your breasts while at work to stay in time with your nursing schedule.  Avoid allowing your breasts to become overly filled with milk (engorged). SEEK MEDICAL CARE  IF:   You have pus-like discharge from the breast.  Your symptoms do not improve with the treatment prescribed by your health care provider within 2 days. SEEK IMMEDIATE MEDICAL CARE IF:   Your pain and swelling are getting worse.  You have pain that is not controlled with medicine.  You have a red line extending from the breast toward your armpit.  You have a fever or persistent symptoms for more than 2-3  days.  You have a fever and your symptoms suddenly get worse. Document Released: 02/18/2005 Document Revised: 02/23/2013 Document Reviewed: 09/18/2012 [facility] Patient Information 2015 [place], [place]. This information is not intended to replace advice given to you by your health care provider. Make sure you discuss any questions you have with your health care provider.

## 2013-11-04 LAB — URINE CULTURE
Colony Count: NO GROWTH
Culture: NO GROWTH

## 2013-11-04 LAB — INFLUENZA PANEL BY PCR (TYPE A & B)
H1N1 flu by pcr: NOT DETECTED
INFLAPCR: NEGATIVE
INFLBPCR: NEGATIVE

## 2014-01-03 ENCOUNTER — Other Ambulatory Visit (HOSPITAL_COMMUNITY): Payer: Self-pay | Admitting: Family Medicine

## 2014-01-03 ENCOUNTER — Encounter (HOSPITAL_COMMUNITY): Payer: Self-pay | Admitting: *Deleted

## 2014-01-03 ENCOUNTER — Other Ambulatory Visit: Payer: Self-pay | Admitting: Family Medicine

## 2014-01-03 DIAGNOSIS — K829 Disease of gallbladder, unspecified: Secondary | ICD-10-CM

## 2014-01-11 ENCOUNTER — Ambulatory Visit
Admission: RE | Admit: 2014-01-11 | Discharge: 2014-01-11 | Disposition: A | Discharge status: Home / Self Care | Payer: BC Managed Care – PPO | Source: Ambulatory Visit | Attending: Family Medicine | Admitting: Family Medicine

## 2014-01-11 DIAGNOSIS — K829 Disease of gallbladder, unspecified: Secondary | ICD-10-CM

## 2014-01-14 ENCOUNTER — Other Ambulatory Visit: Payer: BC Managed Care – PPO

## 2014-06-07 ENCOUNTER — Encounter (HOSPITAL_BASED_OUTPATIENT_CLINIC_OR_DEPARTMENT_OTHER): Payer: Self-pay | Admitting: Emergency Medicine

## 2014-06-07 DIAGNOSIS — Z79899 Other long term (current) drug therapy: Secondary | ICD-10-CM | POA: Insufficient documentation

## 2014-06-07 DIAGNOSIS — R55 Syncope and collapse: Secondary | ICD-10-CM | POA: Diagnosis present

## 2014-06-07 DIAGNOSIS — R0789 Other chest pain: Secondary | ICD-10-CM | POA: Insufficient documentation

## 2014-06-07 DIAGNOSIS — Z8659 Personal history of other mental and behavioral disorders: Secondary | ICD-10-CM | POA: Diagnosis not present

## 2014-06-07 DIAGNOSIS — I10 Essential (primary) hypertension: Secondary | ICD-10-CM | POA: Insufficient documentation

## 2014-06-07 DIAGNOSIS — Z8719 Personal history of other diseases of the digestive system: Secondary | ICD-10-CM | POA: Diagnosis not present

## 2014-06-07 DIAGNOSIS — Z3202 Encounter for pregnancy test, result negative: Secondary | ICD-10-CM | POA: Insufficient documentation

## 2014-06-07 LAB — BASIC METABOLIC PANEL
ANION GAP: 8 (ref 5–15)
BUN: 14 mg/dL (ref 6–23)
CALCIUM: 9.1 mg/dL (ref 8.4–10.5)
CO2: 24 mmol/L (ref 19–32)
Chloride: 106 mmol/L (ref 96–112)
Creatinine, Ser: 1.07 mg/dL (ref 0.50–1.10)
GFR calc Af Amer: 82 mL/min — ABNORMAL LOW (ref >90)
GFR calc non Af Amer: 70 mL/min — ABNORMAL LOW (ref >90)
Glucose, Bld: 112 mg/dL — ABNORMAL HIGH (ref 70–99)
Potassium: 3.7 mmol/L (ref 3.5–5.1)
SODIUM: 138 mmol/L (ref 135–145)

## 2014-06-07 LAB — CBC
HCT: 38.4 % (ref 36.0–46.0)
Hemoglobin: 12.5 g/dL (ref 12.0–15.0)
MCH: 30.2 pg (ref 26.0–34.0)
MCHC: 32.6 g/dL (ref 30.0–36.0)
MCV: 92.8 fL (ref 78.0–100.0)
Platelets: 305 10*3/uL (ref 150–400)
RBC: 4.14 MIL/uL (ref 3.87–5.11)
RDW: 14 % (ref 11.5–15.5)
WBC: 13.5 10*3/uL — ABNORMAL HIGH (ref 4.0–10.5)

## 2014-06-07 LAB — TROPONIN I: Troponin I: <0.03 ng/mL (ref <0.031)

## 2014-06-07 NOTE — ED Notes (Signed)
Patient states that she "passed out" earlier after having some chest pain. The patient has been "normal" since then. Patient had LOC for maybe 1 -2 minutes. Happened at about 630 this evening

## 2014-06-08 ENCOUNTER — Emergency Department (HOSPITAL_BASED_OUTPATIENT_CLINIC_OR_DEPARTMENT_OTHER)
Admission: EM | Admit: 2014-06-08 | Discharge: 2014-06-08 | Disposition: A | Discharge status: Home / Self Care | Payer: BC Managed Care – PPO | Attending: Emergency Medicine | Admitting: Emergency Medicine

## 2014-06-08 DIAGNOSIS — R079 Chest pain, unspecified: Secondary | ICD-10-CM

## 2014-06-08 DIAGNOSIS — R55 Syncope and collapse: Secondary | ICD-10-CM

## 2014-06-08 LAB — URINALYSIS, ROUTINE W REFLEX MICROSCOPIC
Bilirubin Urine: NEGATIVE
Glucose, UA: NEGATIVE mg/dL
Hgb urine dipstick: NEGATIVE
KETONES UR: 15 mg/dL — AB
Nitrite: NEGATIVE
PH: 6.5 (ref 5.0–8.0)
PROTEIN: NEGATIVE mg/dL
SPECIFIC GRAVITY, URINE: 1.029 (ref 1.005–1.030)
UROBILINOGEN UA: 1 mg/dL (ref 0.0–1.0)

## 2014-06-08 LAB — URINE MICROSCOPIC-ADD ON

## 2014-06-08 LAB — PREGNANCY, URINE: Preg Test, Ur: NEGATIVE

## 2014-06-08 NOTE — ED Provider Notes (Signed)
CSN: [id number]     Arrival date & time 06/07/14  2130 History   First MD Initiated Contact with Patient 06/08/14 0109     Chief Complaint  Patient presents with  . Near Syncope     (Consider location/radiation/quality/duration/timing/severity/associated sxs/prior Treatment) Patient is a 29 y.o. female presenting with near-syncope. The history is provided by the patient.  Near Syncope  She states that she had an episode at about 6:30 PM where she developed some fake chest discomfort and then passed out. Also consciousness was estimated about 1 minute. There is no seizure activity nor any post ictal state. She denies palpitations, dyspnea, nausea, vomiting, diaphoresis. She has had no symptoms since regaining consciousness and she has never had anything similar before. She is a nonsmoker without history of diabetes or hyperlipidemia. There is a history of hypertension.  Past Medical History  Diagnosis Date  . 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 Cholecystitis   . Hypertension     PIH  . Anxiety     situational  . Heartburn during pregnancy    Past Surgical History  Procedure Laterality Date  . Tubes in ears    . Fracture surgery      left wrist  . Tendon repair      right hand  . Cesarean section N/A 10/15/2013    Procedure: PRIMARY CESAREAN SECTION;  Surgeon: Purcell Nails, MD;  Location: [facility];  Service: Obstetrics;  Laterality: N/A;   Family History  Problem Relation Age of Onset  . Thyroid disease Mother   . Hypertension Father   . Diabetes Paternal Grandmother   . Diabetes Paternal Grandfather   . Cancer Maternal Grandmother     lung   History  Substance Use Topics  . Smoking status: Never Smoker   . Smokeless tobacco: Not on file  . Alcohol Use: No   OB History    Gravida Para Term Preterm AB TAB SAB Ectopic Multiple Living   0 0 0 0 0 0 1     Review of Systems  Cardiovascular: Positive for near-syncope.  All other systems reviewed and are  negative.     Allergies  Review of patient's allergies indicates no known allergies.  Home Medications   Prior to Admission medications   Medication Sig Start Date End Date Taking? Authorizing Provider  Prenatal Vit-Fe Sulfate-FA (PRENATAL VITAMIN PO) Take by mouth daily.    Historical Provider, MD   BP 144/79 mmHg  Pulse 74  Temp(Src) 98.2 F (36.8 C) (Oral)  Resp 18  Ht  (1.626 m)  Wt 186 lb (84.369 kg)  BMI 31.91 kg/m2  SpO2 100%  LMP 06/06/2014 Physical Exam  Nursing note and vitals reviewed.  29 year old female, resting comfortably and in no acute distress. Vital signs are significant for mild hypertension. Oxygen saturation is 100%, which is normal. Head is normocephalic and atraumatic. PERRLA, EOMI. Oropharynx is clear. Neck is nontender and supple without adenopathy or JVD. There are no carotid bruits. Back is nontender and there is no CVA tenderness. Lungs are clear without rales, wheezes, or rhonchi. Chest is nontender. Heart has regular rate and rhythm without murmur. Abdomen is soft, flat, nontender without masses or hepatosplenomegaly and peristalsis is normoactive. Extremities have no cyanosis or edema, full range of motion is present. Skin is warm and dry without rash. Neurologic: Mental status is normal, cranial nerves are intact, there are no motor or sensory deficits.  ED Course  Procedures (including critical care time) Labs Review Results for orders placed or performed during the hospital encounter of 06/08/14  CBC  (at [facility])  Result Value Ref Range   WBC 13.5 (H) 4.0 - 10.5 K/uL   RBC 4.14 3.87 - 5.11 MIL/uL   Hemoglobin 12.5 12.0 - 15.0 g/dL   HCT 84.138.4 32.436.0 - 40.146.0 %   MCV 92.8 78.0 - 100.0 fL   MCH 30.2 26.0 - 34.0 pg   MCHC 32.6 30.0 - 36.0 g/dL   RDW 02.714.0 25.311.5 - 66.415.5 %   Platelets 305 150 - 400 K/uL  Basic metabolic panel  (at [facility])  Result Value Ref Range   Sodium 138 135 - 145 mmol/L   Potassium 3.7  3.5 - 5.1 mmol/L   Chloride 106 96 - 112 mmol/L   CO2 24 19 - 32 mmol/L   Glucose, Bld 112 (H) 70 - 99 mg/dL   BUN 14 6 - 23 mg/dL   Creatinine, Ser 4.031.07 0.50 - 1.10 mg/dL   Calcium 9.1 8.4 - 47.410.5 mg/dL   GFR calc non Af Amer 70 (L) >90 mL/min   GFR calc Af Amer 82 (L) >90 mL/min   Anion gap 8 5 - 15  Pregnancy, urine (if patient is pre-menopausal female) [facility]  Result Value Ref Range   Preg Test, Ur NEGATIVE NEGATIVE  Urinalysis, Routine w reflex microscopic  Result Value Ref Range   Color, Urine YELLOW YELLOW   APPearance CLEAR CLEAR   Specific Gravity, Urine 1.029 1.005 - 1.030   pH 6.5 5.0 - 8.0   Glucose, UA NEGATIVE NEGATIVE mg/dL   Hgb urine dipstick NEGATIVE NEGATIVE   Bilirubin Urine NEGATIVE NEGATIVE   Ketones, ur 15 (A) NEGATIVE mg/dL   Protein, ur NEGATIVE NEGATIVE mg/dL   Urobilinogen, UA 1.0 0.0 - 1.0 mg/dL   Nitrite NEGATIVE NEGATIVE   Leukocytes, UA SMALL (A) NEGATIVE  Troponin I  Result Value Ref Range   Troponin I <0.03 <0.031 ng/mL  Urine microscopic-add on  Result Value Ref Range   Squamous Epithelial / LPF FEW (A) RARE   WBC, UA 3-6 <3 WBC/hpf   RBC / HPF 0-2 <3 RBC/hpf   Bacteria, UA FEW (A) RARE   Urine-Other MUCOUS PRESENT     EKG Interpretation   Date/Time:  Tuesday June 07 2014 21:36:17 EDT Ventricular Rate:  75 PR Interval:  142 QRS Duration: 80 QT Interval:  392 QTC Calculation: 437 R Axis:   58 Text Interpretation:  Normal sinus rhythm with sinus arrhythmia Normal ECG  No old tracing to compare Confirmed by BELFI  MD, MELANIE ([id number]) on  06/07/2014 9:41:33 PM      MDM   Final diagnoses:  Syncope, unspecified syncope type  Chest pain, unspecified chest pain type    Chest discomfort with syncope of uncertain cause. No red flags to warrant hospitalization, but additional evaluation will be needed. She is referred back to her PCP for additional outpatient workup.    Dione Boozeavid Kazimir Hartnett, MD 06/08/14 [phone]

## 2014-06-08 NOTE — Discharge Instructions (Signed)
Syncope °Syncope is a medical term for fainting or passing out. This means you lose consciousness and drop to the ground. People are generally unconscious for less than 5 minutes. You may have some muscle twitches for up to 15 seconds before waking up and returning to normal. Syncope occurs more often in older adults, but it can happen to anyone. While most causes of syncope are not dangerous, syncope can be a sign of a serious medical problem. It is important to seek medical care.  °CAUSES  °Syncope is caused by a sudden drop in blood flow to the brain. The specific cause is often not determined. Factors that can bring on syncope include: °· Taking medicines that lower blood pressure. °· Sudden changes in posture, such as standing up quickly. °· Taking more medicine than prescribed. °· Standing in one place for too long. °· Seizure disorders. °· Dehydration and excessive exposure to heat. °· Low blood sugar (hypoglycemia). °· Straining to have a bowel movement. °· Heart disease, irregular heartbeat, or other circulatory problems. °· Fear, emotional distress, seeing blood, or severe pain. °SYMPTOMS  °Right before fainting, you may: °· Feel dizzy or light-headed. °· Feel nauseous. °· See all white or all black in your field of vision. °· Have cold, clammy skin. °DIAGNOSIS  °Your health care provider will ask about your symptoms, perform a physical exam, and perform an electrocardiogram (ECG) to record the electrical activity of your heart. Your health care provider may also perform other heart or blood tests to determine the cause of your syncope which may include: °· Transthoracic echocardiogram (TTE). During echocardiography, sound waves are used to evaluate how blood flows through your heart. °· Transesophageal echocardiogram (TEE). °· Cardiac monitoring. This allows your health care provider to monitor your heart rate and rhythm in real time. °· Holter monitor. This is a portable device that records your  heartbeat and can help diagnose heart arrhythmias. It allows your health care provider to track your heart activity for several days, if needed. °· Stress tests by exercise or by giving medicine that makes the heart beat faster. °TREATMENT  °In most cases, no treatment is needed. Depending on the cause of your syncope, your health care provider may recommend changing or stopping some of your medicines. °HOME CARE INSTRUCTIONS °· Have someone stay with you until you feel stable. °· Do not drive, use machinery, or play sports until your health care provider says it is okay. °· Keep all follow-up appointments as directed by your health care provider. °· Lie down right away if you start feeling like you might faint. Breathe deeply and steadily. Wait until all the symptoms have passed. °· Drink enough fluids to keep your urine clear or pale yellow. °· If you are taking blood pressure or heart medicine, get up slowly and take several minutes to sit and then stand. This can reduce dizziness. °SEEK IMMEDIATE MEDICAL CARE IF:  °· You have a severe headache. °· You have unusual pain in the chest, abdomen, or back. °· You are bleeding from your mouth or rectum, or you have black or tarry stool. °· You have an irregular or very fast heartbeat. °· You have pain with breathing. °· You have repeated fainting or seizure-like jerking during an episode. °· You faint when sitting or lying down. °· You have confusion. °· You have trouble walking. °· You have severe weakness. °· You have vision problems. °If you fainted, call your local emergency services (911 in [place] Do not drive   yourself to the hospital.  °MAKE SURE YOU: °· Understand these instructions. °· Will watch your condition. °· Will get help right away if you are not doing well or get worse. °Document Released: 02/18/2005 Document Revised: 02/23/2013 Document Reviewed: 04/19/2011 °[employer]® Patient Information ©2015 [employer]. This information is not intended to replace  advice given to you by your health care provider. Make sure you discuss any questions you have with your health care provider. ° °Chest Pain (Nonspecific) °It is often hard to give a specific diagnosis for the cause of chest pain. There is always a chance that your pain could be related to something serious, such as a heart attack or a blood clot in the lungs. You need to follow up with your health care provider for further evaluation. °CAUSES  °· Heartburn. °· Pneumonia or bronchitis. °· Anxiety or stress. °· Inflammation around your heart (pericarditis) or lung (pleuritis or pleurisy). °· A blood clot in the lung. °· A collapsed lung (pneumothorax). It can develop suddenly on its own (spontaneous pneumothorax) or from trauma to the chest. °· Shingles infection (herpes zoster virus). °The chest wall is composed of bones, muscles, and cartilage. Any of these can be the source of the pain. °· The bones can be bruised by injury. °· The muscles or cartilage can be strained by coughing or overwork. °· The cartilage can be affected by inflammation and become sore (costochondritis). °DIAGNOSIS  °Lab tests or other studies may be needed to find the cause of your pain. Your health care provider may have you take a test called an ambulatory electrocardiogram (ECG). An ECG records your heartbeat patterns over a 24-hour period. You may also have other tests, such as: °· Transthoracic echocardiogram (TTE). During echocardiography, sound waves are used to evaluate how blood flows through your heart. °· Transesophageal echocardiogram (TEE). °· Cardiac monitoring. This allows your health care provider to monitor your heart rate and rhythm in real time. °· Holter monitor. This is a portable device that records your heartbeat and can help diagnose heart arrhythmias. It allows your health care provider to track your heart activity for several days, if needed. °· Stress tests by exercise or by giving medicine that makes the heart beat  faster. °TREATMENT  °· Treatment depends on what may be causing your chest pain. Treatment may include: °¨ Acid blockers for heartburn. °¨ Anti-inflammatory medicine. °¨ Pain medicine for inflammatory conditions. °¨ Antibiotics if an infection is present. °· You may be advised to change lifestyle habits. This includes stopping smoking and avoiding alcohol, caffeine, and chocolate. °· You may be advised to keep your head raised (elevated) when sleeping. This reduces the chance of acid going backward from your stomach into your esophagus. °Most of the time, nonspecific chest pain will improve within 2-3 days with rest and mild pain medicine.  °HOME CARE INSTRUCTIONS  °· If antibiotics were prescribed, take them as directed. Finish them even if you start to feel better. °· For the next few days, avoid physical activities that bring on chest pain. Continue physical activities as directed. °· Do not use any tobacco products, including cigarettes, chewing tobacco, or electronic cigarettes. °· Avoid drinking alcohol. °· Only take medicine as directed by your health care provider. °· Follow your health care provider's suggestions for further testing if your chest pain does not go away. °· Keep any follow-up appointments you made. If you do not go to an appointment, you could develop lasting (chronic) problems with pain. If there is   any problem keeping an appointment, call to reschedule. °SEEK MEDICAL CARE IF:  °· Your chest pain does not go away, even after treatment. °· You have a rash with blisters on your chest. °· You have a fever. °SEEK IMMEDIATE MEDICAL CARE IF:  °· You have increased chest pain or pain that spreads to your arm, neck, jaw, back, or abdomen. °· You have shortness of breath. °· You have an increasing cough, or you cough up blood. °· You have severe back or abdominal pain. °· You feel nauseous or vomit. °· You have severe weakness. °· You faint. °· You have chills. °This is an emergency. Do not wait to  see if the pain will go away. Get medical help at once. Call your local emergency services (911 in [place] Do not drive yourself to the hospital. °MAKE SURE YOU:  °· Understand these instructions. °· Will watch your condition. °· Will get help right away if you are not doing well or get worse. °Document Released: 11/28/2004 Document Revised: 02/23/2013 Document Reviewed: 09/24/2007 °[employer]® Patient Information ©2015 [employer]. This information is not intended to replace advice given to you by your health care provider. Make sure you discuss any questions you have with your health care provider. ° °

## 2014-08-08 IMAGING — US US OB FOLLOW-UP
1 series · 12 of 28 positions shown · non-contrast
Comparison: none

[Series 1: us ob follow-up · 12 of 45 slices shown]
[im 2/45]
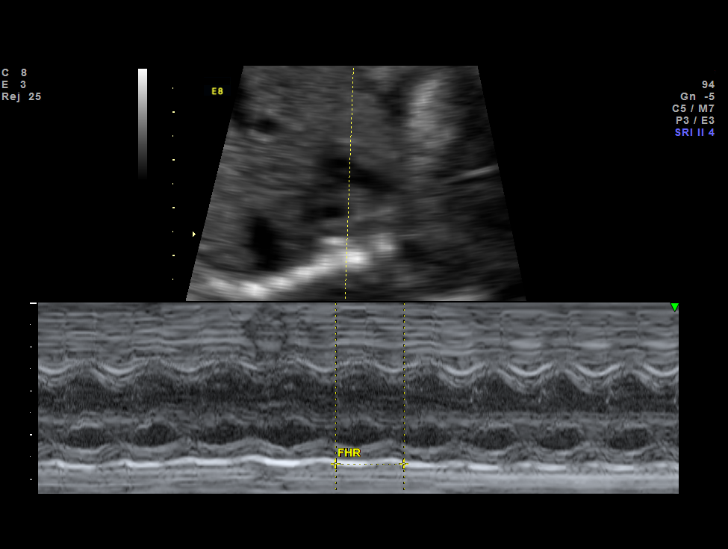
[im 5/45]
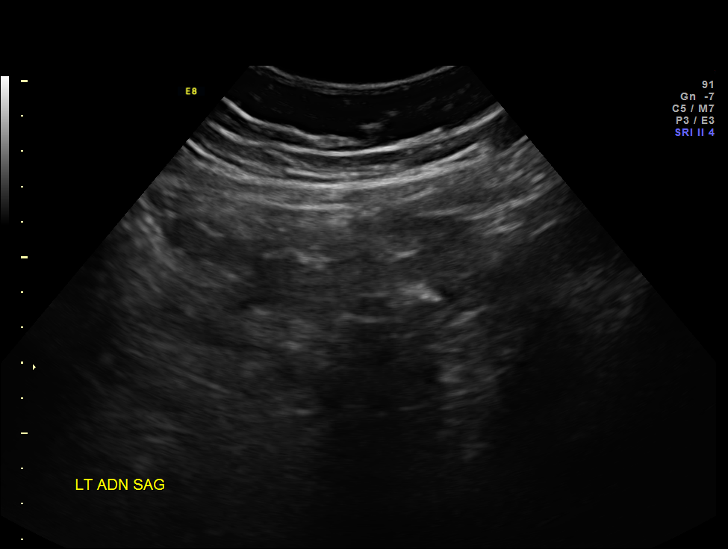
[im 9/45]
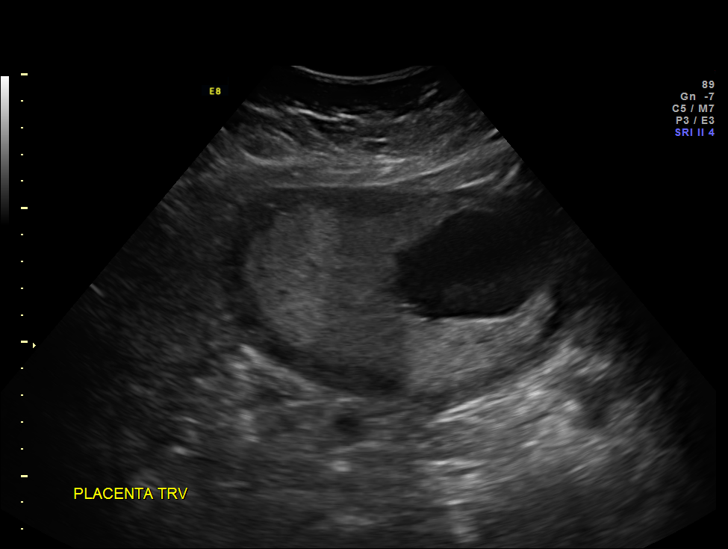
[im 14/45]
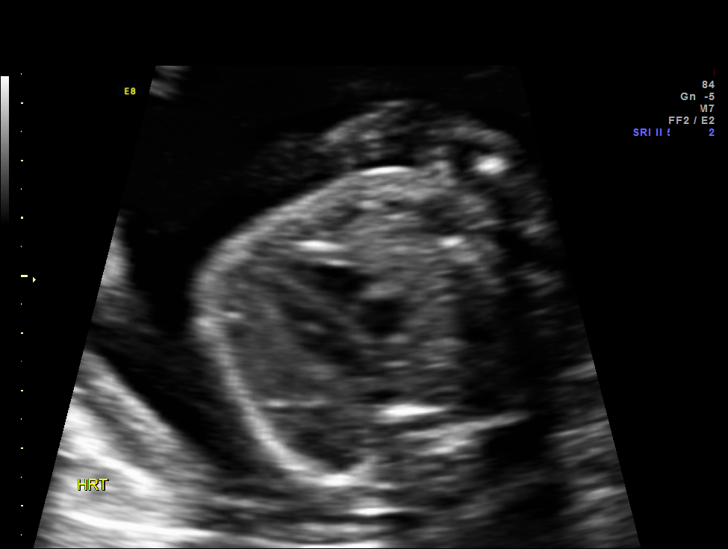
[im 17/45]
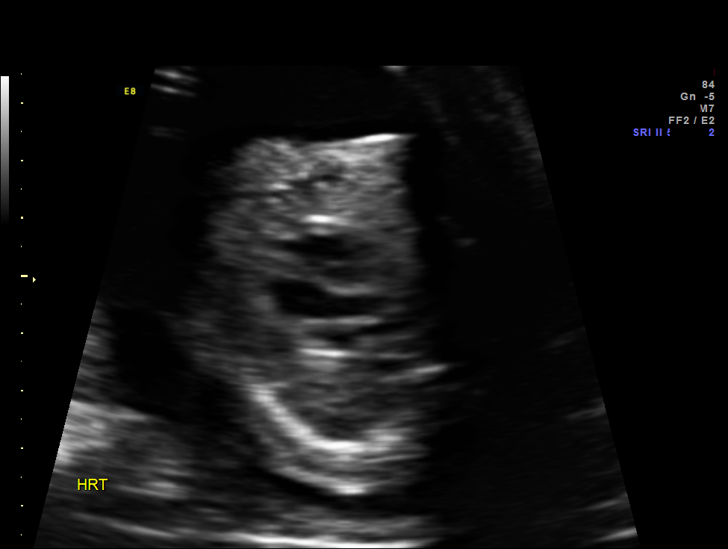
[im 20/45]
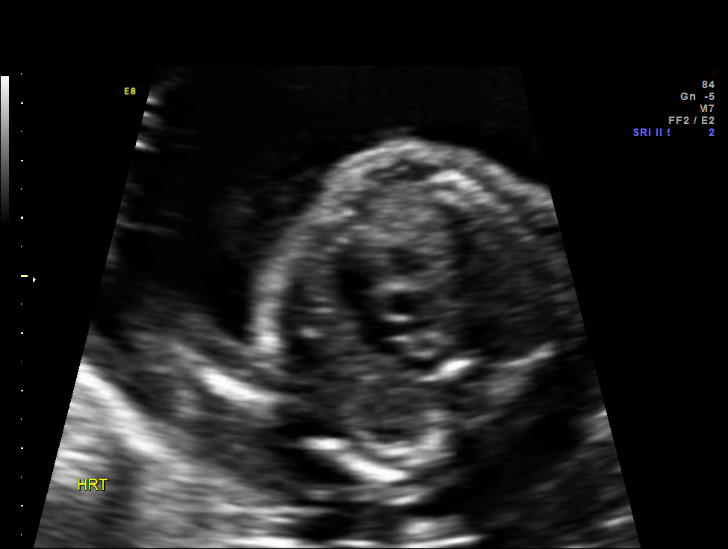
[im 25/45]
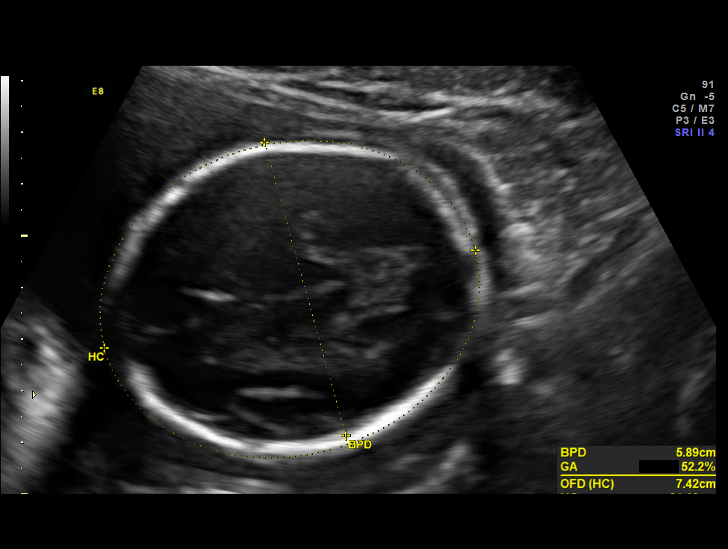
[im 28/45]
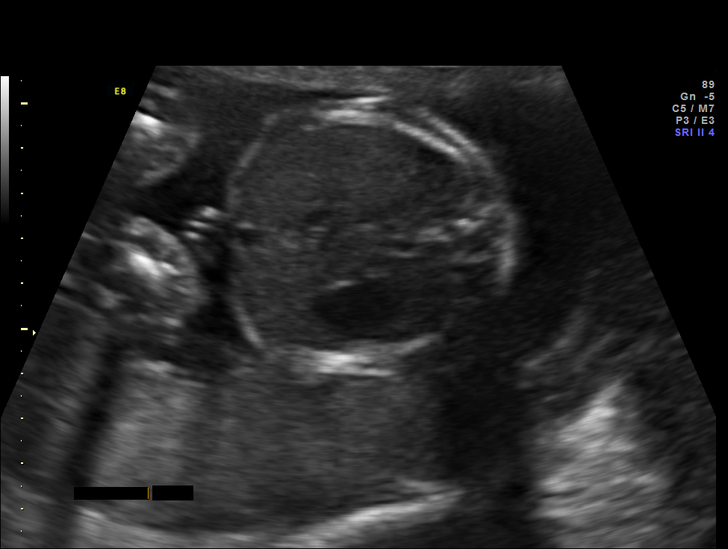
[im 31/45]
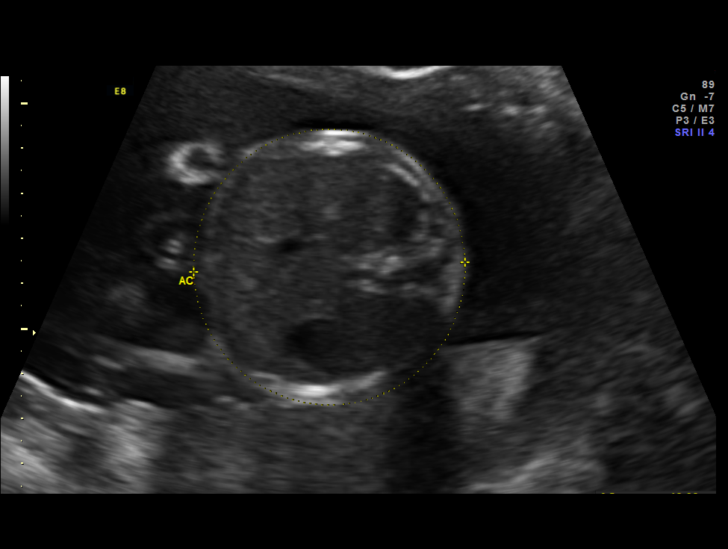
[im 36/45]
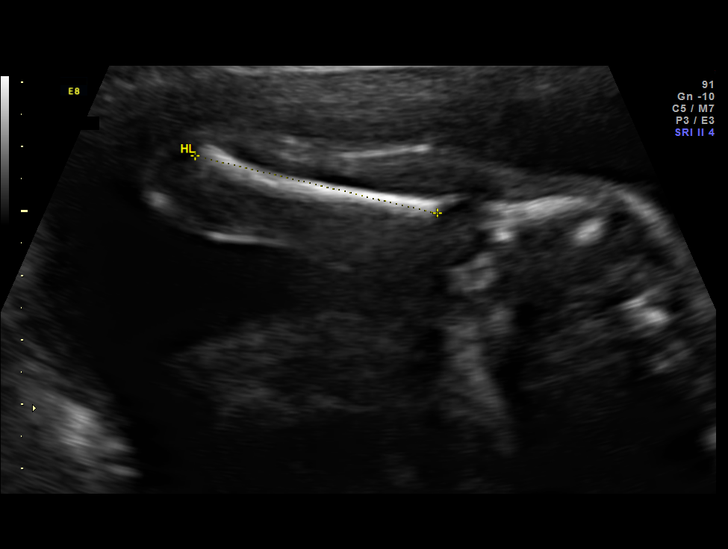
[im 40/45]
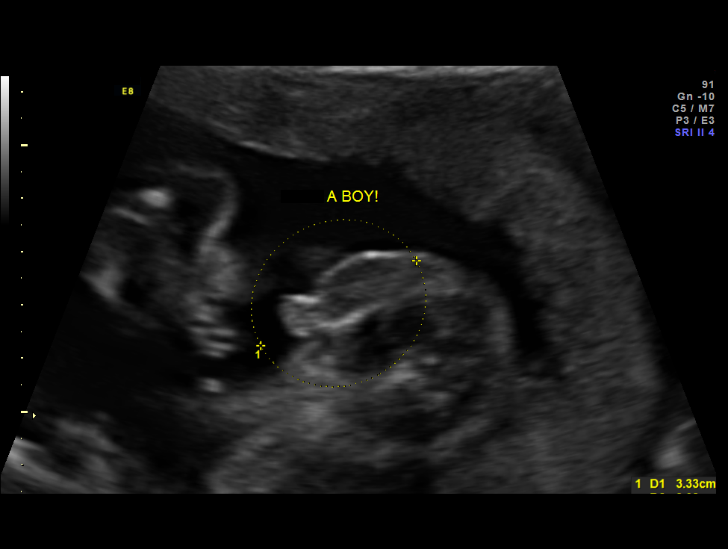
[im 43/45]
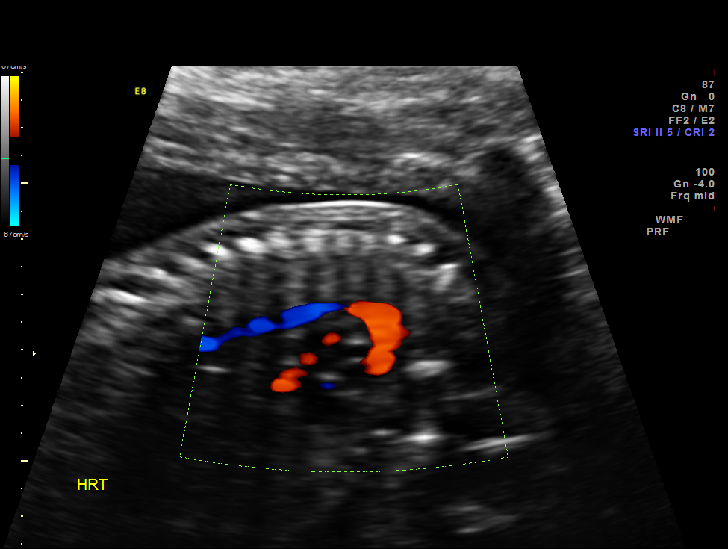

[12 of 28 positions shown; findings below may reference images not displayed]

OBSTETRICS REPORT
                      (Signed Final 07/07/2013 [DATE])

Service(s) Provided

 US OB FOLLOW UP                                       76816.1
Indications

 Follow-up incomplete fetal anatomic evaluation
 History of genetic / anatomic abnormality -
 Autosomal Recessive Polycystic Kidney Disease
 (pt's nephew)
 Echogenic intracardiac focus of the heart  (EIF)
Fetal Evaluation

 Num Of Fetuses:    1
 Fetal Heart Rate:  147                          bpm
 Cardiac Activity:  Observed
 Presentation:      Cephalic
 Placenta:          Posterior Fundal, above
                    cervical os
 P. Cord            Previously Visualized
 Insertion:

 Amniotic Fluid
 AFI FV:      Subjectively within normal limits
                                             Larg Pckt:     7.0  cm
Biometry

 BPD:     58.7  mm     G. Age:  24w 0d                CI:         80.1   70 - 86
 OFD:     73.3  mm                                    FL/HC:      19.2   18.7 -

 HC:     210.7  mm     G. Age:  23w 1d       12  %    HC/AC:      1.08   1.05 -

 AC:     194.4  mm     G. Age:  24w 1d       50  %    FL/BPD:     69.0   71 - 87
 FL:      40.5  mm     G. Age:  23w 1d       17  %    FL/AC:      20.8   20 - 24
 HUM:     38.2  mm     G. Age:  23w 4d       33  %
 CER:       26  mm     G. Age:  24w 0d       52  %

 Est. FW:     615  gm      1 lb 6 oz     48  %
Gestational Age

 LMP:           23w 6d        Date:  01/21/13                 EDD:   10/28/13
 U/S Today:     23w 4d                                        EDD:   10/30/13
 Best:          23w 6d     Det. By:  LMP  (01/21/13)          EDD:   10/28/13
Anatomy

 Cranium:          Appears normal         Aortic Arch:      Appears normal
 Fetal Cavum:      Appears normal         Ductal Arch:      Not well visualized
 Ventricles:       Appears normal         Diaphragm:        Previously seen
 Choroid Plexus:   Previously seen        Stomach:          Appears normal
 Cerebellum:       Appears normal         Abdomen:          Appears normal
 Posterior Fossa:  Appears normal         Abdominal Wall:   Previously seen
 Nuchal Fold:      Previously seen        Cord Vessels:     Previously seen
 Face:             Orbits and profile     Kidneys:          Appear normal
                   previously seen
 Lips:             Previously seen        Bladder:          Appears normal
 Heart:            EIF in LV, Normal      Spine:            Previously seen
                   4CH
 RVOT:             Appears normal         Lower             Previously seen
                                          Extremities:
 LVOT:             Appears normal         Upper             Previously seen
                                          Extremities:

 Other:  Fetus appears to be a male. Heels previously visualized. Nasal bone
         previously visualized. Technically difficult due to fetal position.
Targeted Anatomy

 Fetal Central Nervous System
 Cisterna Magna:
Cervix Uterus Adnexa

 Cervical Length:    3.7      cm

 Cervix:       Normal appearance by transabdominal scan.

 Adnexa:     No abnormality visualized.
Impression

 Single IUP at 23w 6d
 EFW 48th%
 Echogenic intracardiac focus is again seen (previously
 counseled)
 No structural defects seen, noting today's images effectively
 complete the essential portions of the fetal survey
 No other markers associated with aneuploidy noted
 Normal amniotic fluid volume
Recommendations

 Follow up ultrasounds as clinically indicated.

 questions or concerns.

## 2015-02-12 IMAGING — US US ABDOMEN LIMITED
1 series · 14 of 25 positions shown · non-contrast
Comparison: None.

CLINICAL DATA: Cholestasis during pregnancy ; now for follow-up.

EXAM:
US ABDOMEN LIMITED - RIGHT UPPER QUADRANT

[Series 1: us abdomen limited · 0.30mm/px · 14 of 48 slices shown]
[im 1/48]
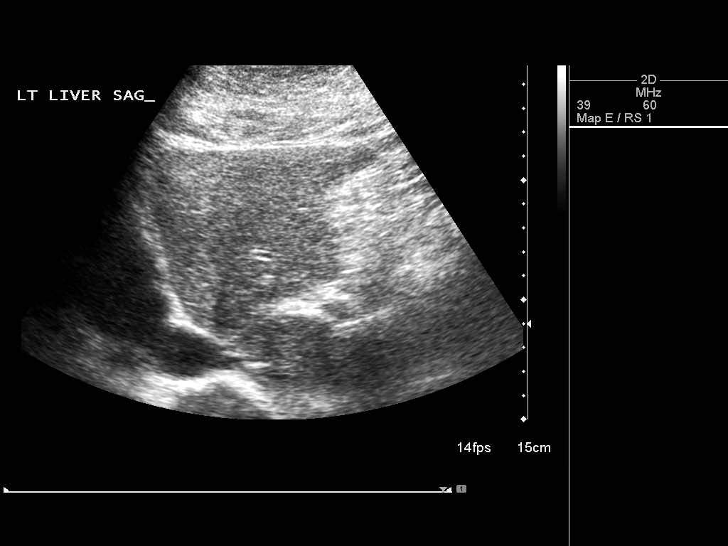
[im 4/48]
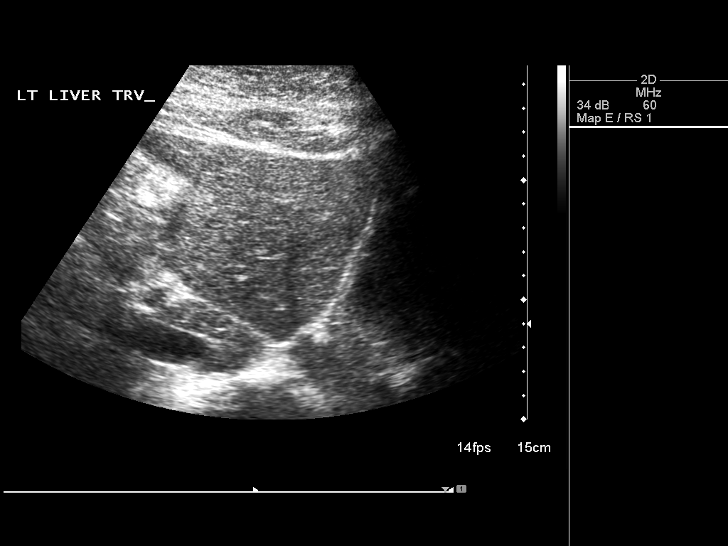
[im 8/48]
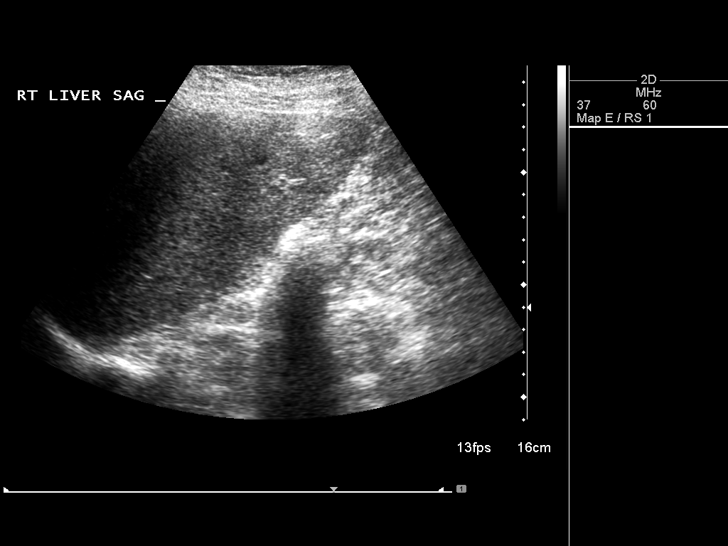
[im 12/48]
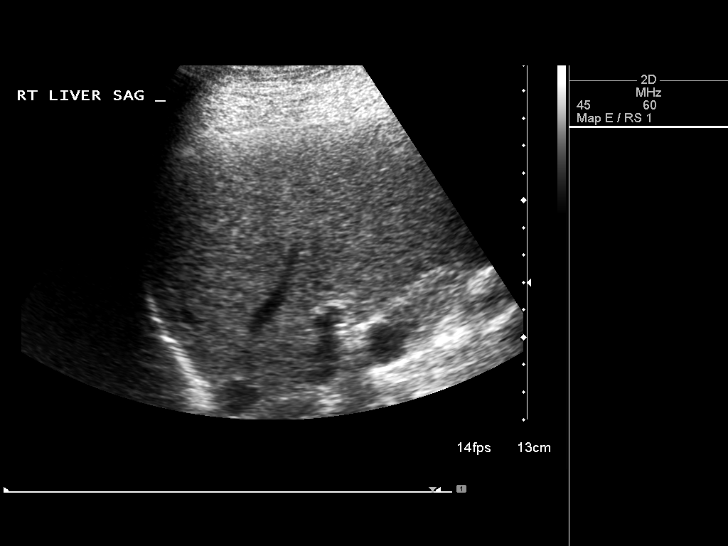
[im 16/48]
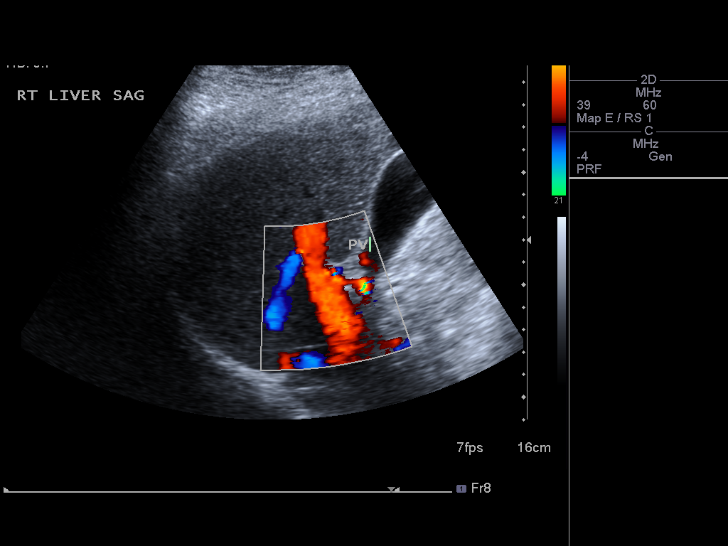
[im 18/48]
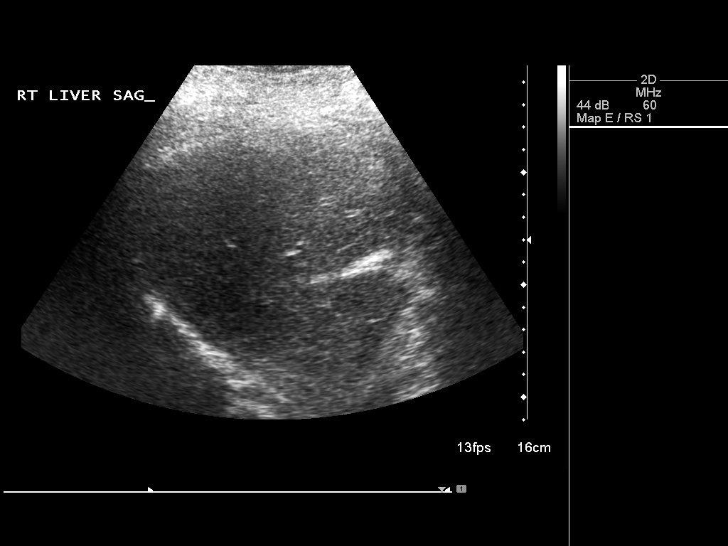
[im 22/48]
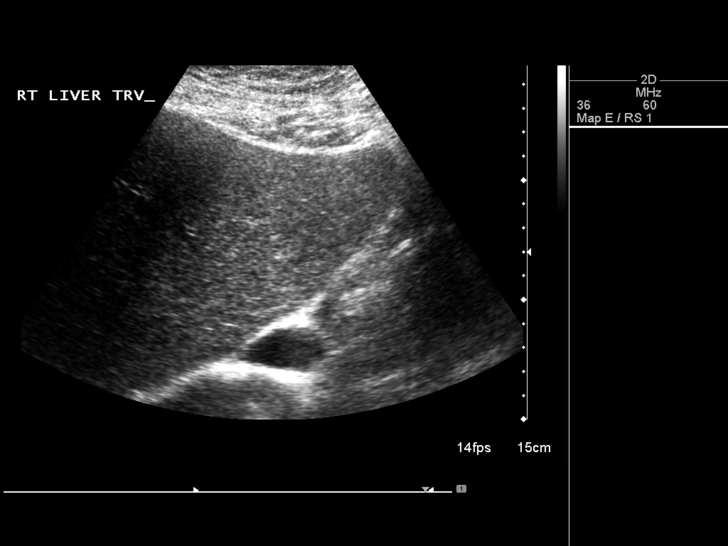
[im 26/48]
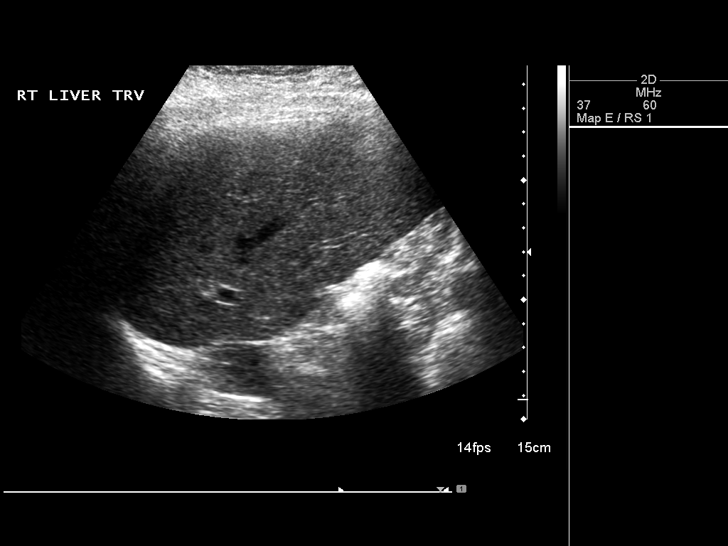
[im 30/48]
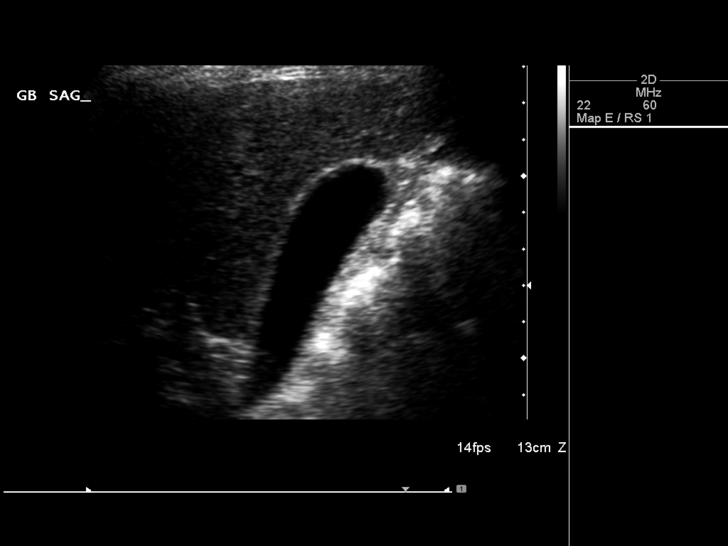
[im 32/48]
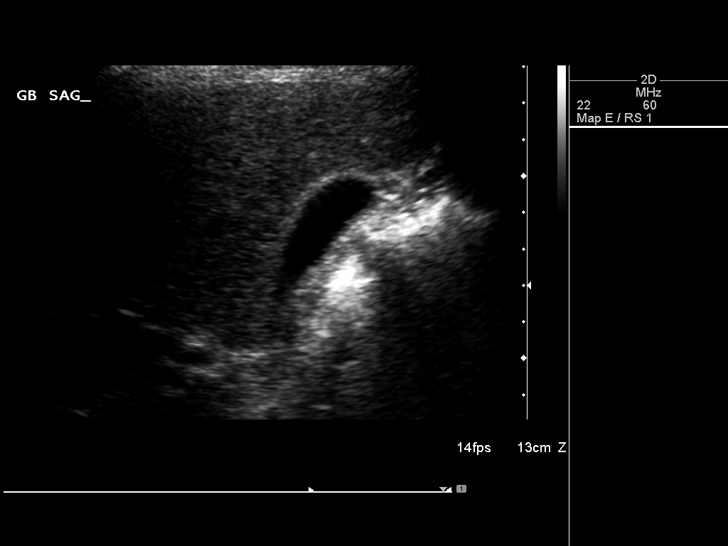
[im 36/48]
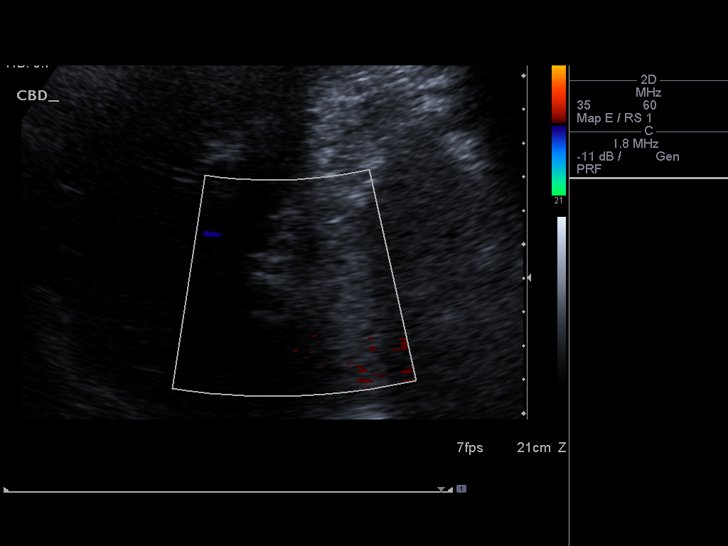
[im 40/48]
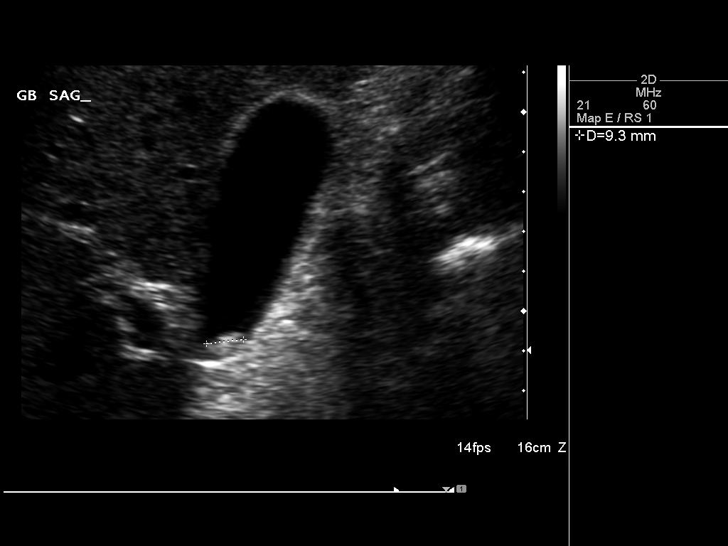
[im 44/48]
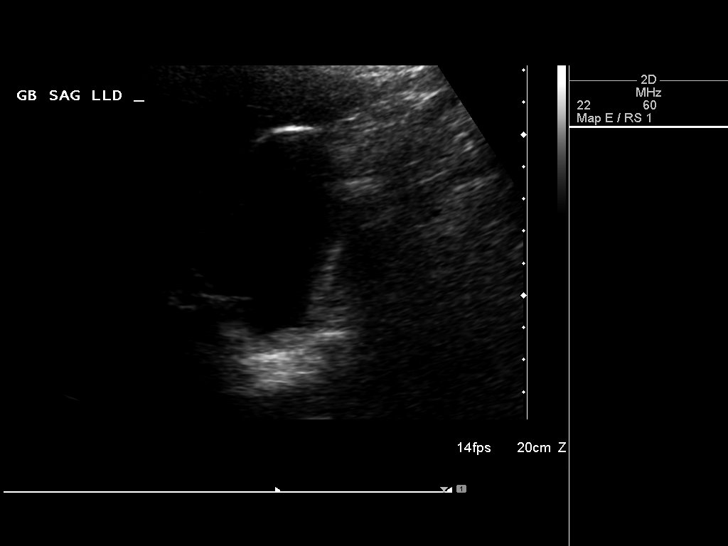
[im 48/48]
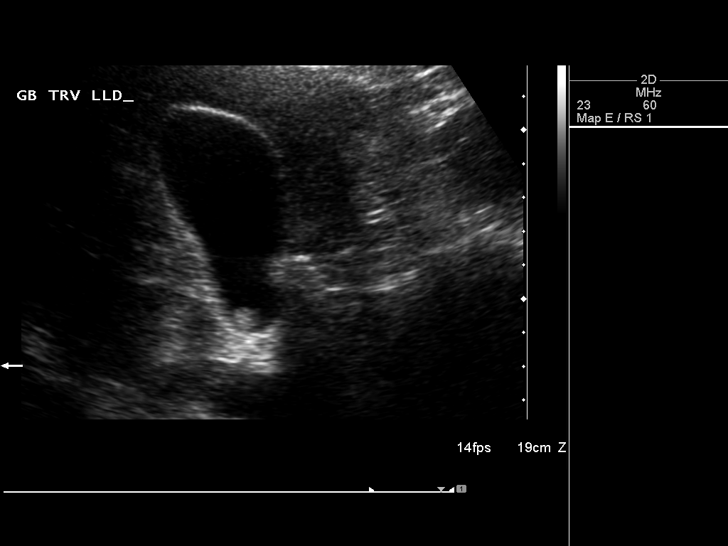

[14 of 25 positions shown; findings below may reference images not displayed]

FINDINGS: Gallbladder:

The gallbladder is adequately distended. There is no wall thickening
or pericholecystic fluid or positive sonographic Murphy's sign.
There is 9 mm echogenic, non shadowing, non mobile focus near the
gallbladder neck. This may reflect an adherent non shadowing stone
versus polyp.

Common bile duct:

Diameter: 4.3 mm

Liver:

The liver exhibits normal echotexture with no focal mass or ductal
dilation.
IMPRESSION: There is no evidence of acute cholecystitis. There is a
noncalcified, non mobile stone versus polyp near the gallbladder
neck. The common bile duct and liver are unremarkable.

## 2016-04-10 ENCOUNTER — Ambulatory Visit (INDEPENDENT_AMBULATORY_CARE_PROVIDER_SITE_OTHER): Payer: BC Managed Care – PPO | Admitting: Physician Assistant

## 2016-04-10 ENCOUNTER — Encounter: Payer: Self-pay | Admitting: Physician Assistant

## 2016-04-10 ENCOUNTER — Other Ambulatory Visit: Payer: Self-pay | Admitting: Physician Assistant

## 2016-04-10 VITALS — BP 122/80 | HR 80 | Temp 98.2°F | Resp 14 | Ht 64.0 in | Wt 196.0 lb

## 2016-04-10 DIAGNOSIS — R5383 Other fatigue: Secondary | ICD-10-CM | POA: Diagnosis not present

## 2016-04-10 DIAGNOSIS — J069 Acute upper respiratory infection, unspecified: Secondary | ICD-10-CM

## 2016-04-10 DIAGNOSIS — B9789 Other viral agents as the cause of diseases classified elsewhere: Secondary | ICD-10-CM | POA: Diagnosis not present

## 2016-04-10 LAB — T4, FREE: Free T4: 0.83 ng/dL (ref 0.60–1.60)

## 2016-04-10 LAB — TSH: TSH: 2.49 u[IU]/mL (ref 0.35–4.50)

## 2016-04-10 NOTE — Progress Notes (Signed)
Pre visit review using our clinic review tool, if applicable. No additional management support is needed unless otherwise documented below in the visit note. 

## 2016-04-10 NOTE — Progress Notes (Signed)
Patient presents to clinic today to establish care.  Acute Concerns: Patient endorses 3 days of nasal congestion with dry cough and runny nose. Endorses some ear pressure and sinus pressure without pain. Has noted very low-grade fever at night only. Denies chest pain or SOB. Has taken Dayquil/Nyquil. Denies known sick contact.   Chronic Issues:  Patient would also like to discuss thyroid testing. Mother with history of hypothyroidism. Patient has noted significant difficulty with weight loss despite increasing exercise and working on a well-balanced diet over the past several months. Notes cold intolerance and fatigue. Denies history of nutrient deficiency.   Health Maintenance: Immunizations -- Has not had flu shot. PAP -- up-to-date per patient. Followed by [facility].    Past Medical History:  Diagnosis Date  . 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 Anxiety    situational  . Cholecystitis   . Heartburn during pregnancy   . Hypertension    PIH    Past Surgical History:  Procedure Laterality Date  . CESAREAN SECTION N/A 10/15/2013   Procedure: PRIMARY CESAREAN SECTION;  Surgeon: Purcell Nails, MD;  Location: [facility];  Service: Obstetrics;  Laterality: N/A;  . FRACTURE SURGERY     left wrist  . TENDON REPAIR     right hand  . tubes in ears      No current outpatient prescriptions on file prior to visit.   No current facility-administered medications on file prior to visit.     No Known Allergies  Family History  Problem Relation Age of Onset  . Thyroid disease Mother   . Hyperlipidemia Mother   . Hypertension Mother   . Hypertension Father   . Hyperlipidemia Father   . Diabetes Paternal Grandmother   . Diabetes Paternal Grandfather   . Cancer Maternal Grandmother     lung  . Hyperlipidemia Maternal Uncle   . Hypertension Maternal Uncle   . Hyperlipidemia Paternal Aunt   . Hypertension Paternal Aunt   . Hyperlipidemia Paternal Uncle   . Hypertension  Paternal Uncle     Social History   Social History  . Marital status: Married    Spouse name: N/A  . Number of children: 1  . Years of education: 4   Occupational History  . Teacher    Social History Main Topics  . Smoking status: Never Smoker  . Smokeless tobacco: Never Used  . Alcohol use No  . Drug use: No  . Sexual activity: Yes    Partners: Male    Birth control/ protection: Inserts     Comment: Nuvaring   Other Topics Concern  . Not on file   Social History Narrative  . No narrative on file   Review of Systems  Constitutional: Positive for malaise/fatigue. Negative for fever and weight loss.  HENT: Positive for congestion. Negative for ear discharge, ear pain, hearing loss, sinus pain and tinnitus.   Eyes: Negative for blurred vision, double vision, photophobia and pain.  Respiratory: Positive for cough. Negative for shortness of breath.   Cardiovascular: Negative for chest pain and palpitations.  Gastrointestinal: Negative for abdominal pain, blood in stool, constipation, diarrhea, heartburn, melena, nausea and vomiting.  Genitourinary: Negative for dysuria, flank pain, frequency, hematuria and urgency.  Musculoskeletal: Negative for falls.  Neurological: Negative for dizziness, loss of consciousness and headaches.  Endo/Heme/Allergies: Negative for environmental allergies.  Psychiatric/Behavioral: Negative for depression, hallucinations, substance abuse and suicidal ideas. The patient is not nervous/anxious and does not have insomnia.  BP 122/80   Pulse 80   Temp 98.2 F (36.8 C) (Oral)   Resp 14   Ht 5\' 4"  (1.626 m)   Wt 196 lb (88.9 kg)   SpO2 98%   BMI 33.64 kg/m   Physical Exam  Constitutional: She is oriented to person, place, and time and well-developed, well-nourished, and in no distress.  HENT:  Head: Normocephalic and atraumatic.  Right Ear: External ear normal.  Left Ear: External ear normal.  Nose: Nose normal.  Mouth/Throat:  Oropharynx is clear and moist.  Eyes: Conjunctivae are normal.  Neck: Neck supple. No thyromegaly present.  Cardiovascular: Normal rate, regular rhythm, normal heart sounds and intact distal pulses.   Pulmonary/Chest: Effort normal and breath sounds normal. No respiratory distress. She has no wheezes. She has no rales. She exhibits no tenderness.  Neurological: She is alert and oriented to person, place, and time.  Skin: Skin is warm and dry. No rash noted.  Psychiatric: Affect normal.  Vitals reviewed.  Assessment/Plan: 1. Other fatigue Lab panel today to assess, specifically assessing TSH level. Vitamin D level added as well.  - TSH - T4, free - Vitamin D 1,25 dihydroxy  2. Viral URI with cough Supportive measures and OTC medications reviewed. Symptoms mild. Afebrile. Declines flu testing. Does not seem flu-like illness. Return precautions reviewed with patient.    Piedad ClimesMartin, Gari Trovato Cody, PA-C

## 2016-04-10 NOTE — Patient Instructions (Addendum)
Please increase fluids and get plenty of rest. Take over the counter Delsym or Mucinex-DM for cough and congestion. Place a humidifier in the bedroom.  Please go to the lab for blood work. I will call you with your results.  If symptoms are not gradually improving, please call or come see me.   Please schedule an appointment for a complete physical.

## 2016-04-14 LAB — VITAMIN D 1,25 DIHYDROXY
Vitamin D 1, 25 (OH)2 Total: 54 pg/mL (ref 18–72)
Vitamin D2 1, 25 (OH)2: <8 pg/mL
Vitamin D3 1, 25 (OH)2: 54 pg/mL

## 2016-04-15 ENCOUNTER — Encounter: Payer: Self-pay | Admitting: Emergency Medicine

## 2016-04-25 ENCOUNTER — Encounter: Payer: Self-pay | Admitting: *Deleted

## 2016-05-29 DIAGNOSIS — Z98891 History of uterine scar from previous surgery: Secondary | ICD-10-CM | POA: Insufficient documentation

## 2017-02-27 ENCOUNTER — Encounter: Payer: Self-pay | Admitting: Family Medicine

## 2017-02-27 ENCOUNTER — Ambulatory Visit: Payer: BC Managed Care – PPO | Admitting: Family Medicine

## 2017-02-27 VITALS — BP 108/70 | HR 81 | Temp 98.3°F | Ht 64.0 in | Wt 196.0 lb

## 2017-02-27 DIAGNOSIS — B351 Tinea unguium: Secondary | ICD-10-CM | POA: Diagnosis not present

## 2017-02-27 NOTE — Patient Instructions (Signed)
Monitor your nails, they may fall off; we want to see if the new nail grows back normally. Let us know if it becomes red or painful.   Please follow up if symptoms do not improve or as needed.

## 2017-02-27 NOTE — Progress Notes (Signed)
Subjective  CC:  Chief Complaint  Patient presents with  . Toenails    Bil feet middle toes nails coming off.    HPI: Amber Nguyen is a 31 y.o. female who presents to the office today to address the problems listed above in the chief complaint.  Noticed that bilateral middle toenails are thickened, discolored and lifting w/o pain, known trauma or redness. Denies running or frequent exercise or pain in shoes. Does get intermittent pedicures, last being about 4 months ago. No other sxs.   I reviewed the patients updated PMH, FH, and SocHx.    Patient Active Problem List   Diagnosis Date Noted  . Postpartum fever 11/03/2013  . Mild or unspecified pre-eclampsia, with delivery 10/17/2013  . S/P primary low transverse C-section 10/15/2013  . Morbid obesity (HCC) 10/13/2013  . Cholestasis of pregnancy 10/13/2013  . Elevated LFTs 10/13/2013   No outpatient medications have been marked as taking for the 02/27/17 encounter (Office Visit) with Willow OraAndy, Jiovany Scheffel L, MD.    Allergies: Patient has No Known Allergies. Family History: Patient family history includes Cancer in her maternal grandmother; Diabetes in her paternal grandfather and paternal grandmother; Healthy in her son; Heart attack in her paternal uncle; Hyperlipidemia in her father, maternal uncle, mother, paternal aunt, and paternal uncle; Hypertension in her father, maternal uncle, mother, paternal aunt, and paternal uncle; Thyroid disease in her mother. Social History:  Patient  reports that  has never smoked. she has never used smokeless tobacco. She reports that she does not drink alcohol or use drugs.  Review of Systems: Constitutional: Negative for fever malaise or anorexia Cardiovascular: negative for chest pain Respiratory: negative for SOB or persistent cough Gastrointestinal: negative for abdominal pain  Objective  Vitals: BP 108/70 (BP Location: Left Arm, Patient Position: Sitting, Cuff Size: Large)   Pulse 81    Temp 98.3 F (36.8 C) (Oral)   Ht 5\' 4"  (1.626 m)   Wt 196 lb (88.9 kg)   LMP  (LMP Unknown)   SpO2 100%   Breastfeeding? No   BMI 33.64 kg/m  General: no acute distress , A&Ox3 Skin:  Warm, no rashes, bilateral middle toenails are thickened and raised, proximally at base of nail. No ttp or redness or drainage. They are discolored. No streaking or pitting or ridges.   Assessment  1. Onychomycosis      Plan   Fungal toenails most likely dx:  Educated on monitoring; nails will likely grow out and may fall off. Will monitor new nail growth. IF look fungal, can then consider treatment. Return if pain or redness develop.  Follow up: Return if symptoms worsen or fail to improve.    Commons side effects, risks, benefits, and alternatives for medications and treatment plan prescribed today were discussed, and the patient expressed understanding of the given instructions. Patient is instructed to call or message via MyChart if he/she has any questions or concerns regarding our treatment plan. No barriers to understanding were identified. We discussed Red Flag symptoms and signs in detail. Patient expressed understanding regarding what to do in case of urgent or emergency type symptoms.   Medication list was reconciled, printed and provided to the patient in AVS. Patient instructions and summary information was reviewed with the patient as documented in the AVS. This note was prepared with assistance of Dragon voice recognition software. Occasional wrong-word or sound-a-like substitutions may have occurred due to the inherent limitations of voice recognition software  No orders of the defined  types were placed in this encounter.  No orders of the defined types were placed in this encounter.

## 2017-04-07 ENCOUNTER — Encounter: Payer: Self-pay | Admitting: Physician Assistant

## 2017-04-07 ENCOUNTER — Other Ambulatory Visit: Payer: Self-pay

## 2017-04-07 ENCOUNTER — Ambulatory Visit: Payer: BC Managed Care – PPO | Admitting: Physician Assistant

## 2017-04-07 VITALS — BP 130/98 | HR 70 | Temp 98.3°F | Resp 14 | Ht 64.0 in | Wt 199.0 lb

## 2017-04-07 DIAGNOSIS — S46812A Strain of other muscles, fascia and tendons at shoulder and upper arm level, left arm, initial encounter: Secondary | ICD-10-CM | POA: Diagnosis not present

## 2017-04-07 MED ORDER — CYCLOBENZAPRINE HCL 5 MG PO TABS: 5.0000 mg | ORAL_TABLET | Freq: Three times a day (TID) | ORAL | 0 refills | Status: DC | PRN

## 2017-04-07 NOTE — Progress Notes (Signed)
Patient presents to clinic today c/o pain and tension in left neck and shoulder starting this morning after she strained her neck getting out of bed quickly. Denies any pain radiating into L arm. Denies decreased ROM but notes pain with ROM. Denies numbness or tingling. Has not taken anything for symptoms.   Past Medical History:  Diagnosis Date  . 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 Anxiety    situational  . Cholecystitis   . Heartburn during pregnancy   . Pre-eclampsia     Current Outpatient Medications on File Prior to Visit  Medication Sig Dispense Refill  . etonogestrel-ethinyl estradiol (NUVARING) 0.12-0.015 MG/24HR vaginal ring Place 1 each vaginally every 28 (twenty-eight) days. Insert vaginally and leave in place for 3 consecutive weeks, then remove for 1 week.     No current facility-administered medications on file prior to visit.     No Known Allergies  Family History  Problem Relation Age of Onset  . Thyroid disease Mother   . Hyperlipidemia Mother   . Hypertension Mother   . Hypertension Father   . Hyperlipidemia Father   . Diabetes Paternal Grandmother   . Diabetes Paternal Grandfather   . Cancer Maternal Grandmother        lung  . Hyperlipidemia Maternal Uncle   . Hypertension Maternal Uncle   . Hyperlipidemia Paternal Aunt   . Hypertension Paternal Aunt   . Hyperlipidemia Paternal Uncle   . Hypertension Paternal Uncle   . Heart attack Paternal Uncle   . Healthy Son        2    Social History   Socioeconomic History  . Marital status: Married    Spouse name: Baird LyonsCasey  . Number of children: 1  . Years of education: 4  . Highest education level: None  Social Needs  . Financial resource strain: None  . Food insecurity - worry: None  . Food insecurity - inability: None  . Transportation needs - medical: None  . Transportation needs - non-medical: None  Occupational History  . Occupation: Runner, broadcasting/film/videoTeacher  Tobacco Use  . Smoking status: Never Smoker  .  Smokeless tobacco: Never Used  Substance and Sexual Activity  . Alcohol use: No    Alcohol/week: 0.0 oz  . Drug use: No  . Sexual activity: Yes    Partners: Male    Birth control/protection: Inserts    Comment: Nuvaring  Other Topics Concern  . None  Social History Narrative  . None   Review of Systems - See HPI.  All other ROS are negative.  BP (!) 130/98   Pulse 70   Temp 98.3 F (36.8 C) (Oral)   Resp 14   Ht 5\' 4"  (1.626 m)   Wt 199 lb (90.3 kg)   SpO2 99%   BMI 34.16 kg/m   Physical Exam  Constitutional: She is oriented to person, place, and time and well-developed, well-nourished, and in no distress.  HENT:  Head: Normocephalic and atraumatic.  Eyes: Conjunctivae are normal.  Neck: Normal range of motion. Neck supple. Muscular tenderness present. No spinous process tenderness present. No edema and no erythema present.  Cardiovascular: Normal rate, regular rhythm, normal heart sounds and intact distal pulses.  Pulmonary/Chest: Effort normal and breath sounds normal. No respiratory distress. She has no wheezes. She has no rales. She exhibits no tenderness.  Neurological: She is alert and oriented to person, place, and time.  Skin: Skin is warm. No rash noted.  Vitals reviewed.  Assessment/Plan:  1. Trapezius strain, left, initial encounter Supportive measures and OTC medications reviewed. Topical Icy Hot recommended in addition to Aleve. Rx Flexeril 5 mg to help with muscle tension. No driving or operating heavy machinery while on medication. Follow-up if symptoms are not resolving in a few days.     Piedad Climes, PA-C

## 2017-04-07 NOTE — Patient Instructions (Signed)
Please avoid heavy lifting and overexertion. Use the Flexeril as directed for muscle tension and spasm. Do not drive or operate machinery while on this medication.  Aleve or Ibuprofen for pain if needed.  Apply a heating pad to the area for 10 minute intervals, a few times per day.

## 2017-04-16 ENCOUNTER — Encounter: Payer: Self-pay | Admitting: Emergency Medicine

## 2017-04-29 ENCOUNTER — Other Ambulatory Visit: Payer: Self-pay

## 2017-04-29 ENCOUNTER — Ambulatory Visit: Payer: BC Managed Care – PPO | Admitting: Family Medicine

## 2017-04-29 ENCOUNTER — Encounter: Payer: Self-pay | Admitting: Family Medicine

## 2017-04-29 VITALS — BP 142/90 | HR 78 | Temp 98.3°F | Resp 17 | Ht 64.0 in | Wt 196.6 lb

## 2017-04-29 DIAGNOSIS — L03211 Cellulitis of face: Secondary | ICD-10-CM

## 2017-04-29 MED ORDER — CEPHALEXIN 500 MG PO CAPS
500.0000 mg | ORAL_CAPSULE | Freq: Two times a day (BID) | ORAL | 0 refills | Status: DC
Start: 2017-04-29 — End: 2017-05-07

## 2017-04-29 NOTE — Patient Instructions (Signed)
Please follow up if symptoms do not improve or as needed.   Cellulitis, Adult Cellulitis is a skin infection. The infected area is usually red and tender. This condition occurs most often in the arms and lower legs. The infection can travel to the muscles, blood, and underlying tissue and become serious. It is very important to get treated for this condition. What are the causes? Cellulitis is caused by bacteria. The bacteria enter through a break in the skin, such as a cut, burn, insect bite, open sore, or crack. What increases the risk? This condition is more likely to occur in people who:  Have a weak defense system (immune system).  Have open wounds on the skin such as cuts, burns, bites, and scrapes. Bacteria can enter the body through these open wounds.  Are older.  Have diabetes.  Have a type of long-lasting (chronic) liver disease (cirrhosis) or kidney disease.  Use IV drugs.  What are the signs or symptoms? Symptoms of this condition include:  Redness, streaking, or spotting on the skin.  Swollen area of the skin.  Tenderness or pain when an area of the skin is touched.  Warm skin.  Fever.  Chills.  Blisters.  How is this diagnosed? This condition is diagnosed based on a medical history and physical exam. You may also have tests, including:  Blood tests.  Lab tests.  Imaging tests.  How is this treated? Treatment for this condition may include:  Medicines, such as antibiotic medicines or antihistamines.  Supportive care, such as rest and application of cold or warm cloths (cold or warm compresses) to the skin.  Hospital care, if the condition is severe.  The infection usually gets better within 1-2 days of treatment. Follow these instructions at home:  Take over-the-counter and prescription medicines only as told by your health care provider.  If you were prescribed an antibiotic medicine, take it as told by your health care provider. Do not stop  taking the antibiotic even if you start to feel better.  Drink enough fluid to keep your urine clear or pale yellow.  Do not touch or rub the infected area.  Raise (elevate) the infected area above the level of your heart while you are sitting or lying down.  Apply warm or cold compresses to the affected area as told by your health care provider.  Keep all follow-up visits as told by your health care provider. This is important. These visits let your health care provider make sure a more serious infection is not developing. Contact a health care provider if:  You have a fever.  Your symptoms do not improve within 1-2 days of starting treatment.  Your bone or joint underneath the infected area becomes painful after the skin has healed.  Your infection returns in the same area or another area.  You notice a swollen bump in the infected area.  You develop new symptoms.  You have a general ill feeling (malaise) with muscle aches and pains. Get help right away if:  Your symptoms get worse.  You feel very sleepy.  You develop vomiting or diarrhea that persists.  You notice red streaks coming from the infected area.  Your red area gets larger or turns dark in color. This information is not intended to replace advice given to you by your health care provider. Make sure you discuss any questions you have with your health care provider. Document Released: 11/28/2004 Document Revised: 06/29/2015 Document Reviewed: 12/28/2014 Elsevier Interactive Patient Education  2018 [employer]  

## 2017-04-29 NOTE — Progress Notes (Signed)
Subjective  CC:  Chief Complaint  Patient presents with  . Rash    Round circle redness on bottom of chin    HPI: Amber MillerLauren E Nguyen is a 32 y.o. female who presents to the office today to address the problems listed above in the chief complaint.  2-day history of red mildly tender area noted on chin.  No trauma.  No known spider bite.  No associated fevers.  No blistering or drainage present. I reviewed the patients updated PMH, FH, and SocHx.    Patient Active Problem List   Diagnosis Date Noted  . History of cesarean section 05/29/2016  . Postpartum fever 11/03/2013  . Mild or unspecified pre-eclampsia, with delivery 10/17/2013  . S/P primary low transverse C-section 10/15/2013  . Morbid obesity (HCC) 10/13/2013  . Cholestasis of pregnancy 10/13/2013  . Elevated LFTs 10/13/2013   Current Meds  Medication Sig  . etonogestrel-ethinyl estradiol (NUVARING) 0.12-0.015 MG/24HR vaginal ring Place 1 each vaginally every 28 (twenty-eight) days. Insert vaginally and leave in place for 3 consecutive weeks, then remove for 1 week.    Allergies: Patient has No Known Allergies. Family History: Patient family history includes Cancer in her maternal grandmother; Diabetes in her paternal grandfather and paternal grandmother; Healthy in her son; Heart attack in her paternal uncle; Hyperlipidemia in her father, maternal uncle, mother, paternal aunt, and paternal uncle; Hypertension in her father, maternal uncle, mother, paternal aunt, and paternal uncle; Thyroid disease in her mother. Social History:  Patient  reports that  has never smoked. she has never used smokeless tobacco. She reports that she does not drink alcohol or use drugs.  Review of Systems: Constitutional: Negative for fever malaise or anorexia Cardiovascular: negative for chest pain Respiratory: negative for SOB or persistent cough Gastrointestinal: negative for abdominal pain  Objective  Vitals: BP (!) 142/90   Pulse 78    Temp 98.3 F (36.8 C) (Oral)   Resp 17   Ht 5\' 4"  (1.626 m)   Wt 196 lb 9.6 oz (89.2 kg)   SpO2 99%   BMI 33.75 kg/m  General: no acute distress , A&Ox3 HEENT: PEERL, conjunctiva normal, Oropharynx moist,neck is supple, right anterior cervical lymphadenopathy present Cardiovascular:  RRR without murmur or gallop.  Respiratory:  Good breath sounds bilaterally, CTAB with normal respiratory effort Skin:  Warm, dime sized annular indurated erythematous tender area on right chin.  No flaking.  No crusting but possible early vesicles developing.  Assessment  1. Cellulitis of chin      Plan  Treat with antibiotics and monitor for resolution of symptoms.  Discussed differential diagnosis, given tenderness, induration, and lymphadenopathy believe infection is the most likely cause.  She will return if things are worsening or not improving. Follow up: Return if symptoms worsen or fail to improve.    Commons side effects, risks, benefits, and alternatives for medications and treatment plan prescribed today were discussed, and the patient expressed understanding of the given instructions. Patient is instructed to call or message via MyChart if he/she has any questions or concerns regarding our treatment plan. No barriers to understanding were identified. We discussed Red Flag symptoms and signs in detail. Patient expressed understanding regarding what to do in case of urgent or emergency type symptoms.   Medication list was reconciled, printed and provided to the patient in AVS. Patient instructions and summary information was reviewed with the patient as documented in the AVS. This note was prepared with assistance of Dragon voice recognition software.  Occasional wrong-word or sound-a-like substitutions may have occurred due to the inherent limitations of voice recognition software  No orders of the defined types were placed in this encounter.  Meds ordered this encounter  Medications  .  cephALEXin (KEFLEX) 500 MG capsule    Sig: Take 1 capsule (500 mg total) by mouth 2 (two) times daily.    Dispense:  14 capsule    Refill:  0

## 2017-04-30 ENCOUNTER — Encounter: Payer: Self-pay | Admitting: Family Medicine

## 2017-05-07 ENCOUNTER — Encounter: Payer: Self-pay | Admitting: Physician Assistant

## 2017-05-07 ENCOUNTER — Other Ambulatory Visit: Payer: Self-pay

## 2017-05-07 ENCOUNTER — Ambulatory Visit: Payer: BC Managed Care – PPO | Admitting: Physician Assistant

## 2017-05-07 VITALS — BP 118/80 | HR 77 | Temp 97.5°F | Resp 14 | Ht 64.0 in | Wt 196.0 lb

## 2017-05-07 DIAGNOSIS — Z8639 Personal history of other endocrine, nutritional and metabolic disease: Secondary | ICD-10-CM

## 2017-05-07 DIAGNOSIS — E669 Obesity, unspecified: Secondary | ICD-10-CM

## 2017-05-07 LAB — TSH: TSH: 1.57 u[IU]/mL (ref 0.35–4.50)

## 2017-05-07 LAB — T4, FREE: FREE T4: 1.03 ng/dL (ref 0.60–1.60)

## 2017-05-07 NOTE — Patient Instructions (Signed)
Please keep up with diet and exercise. You are doing great with this!  Please go to the lab today for blood work.  I will call you with your results. We will alter treatment regimen(s) if indicated by your results.

## 2017-05-07 NOTE — Progress Notes (Signed)
Patient presents to clinic today to discuss weight.  Patient has struggled with weight loss for some time.  Has been attending a boot camp since December.  Is doing intense cardio and resistance training for 45 minutes, 5 times a week.  Is restricting herself to a 1200-1400 -calorie/day diet.  Is keeping hydrated and trying to keep  good protein intake.  Patient has noted only a couple pound weight loss since making these changes.  Denies any change in how her clothes are fitting.  Patient does endorse a family history of hypothyroidism.  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  States she had taking growth hormone when younger.  Stopped at 19.  Notes some fatigue headache constipation.  Denies dry skin.  Denies depressed mood or anhedonia.   Past Medical History:  Diagnosis Date  . 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 Anxiety    situational  . Cholecystitis   . Heartburn during pregnancy   . Pre-eclampsia     Current Outpatient Medications on File Prior to Visit  Medication Sig Dispense Refill  . cephALEXin (KEFLEX) 500 MG capsule Take 1 capsule (500 mg total) by mouth 2 (two) times daily. 14 capsule 0  . etonogestrel-ethinyl estradiol (NUVARING) 0.12-0.015 MG/24HR vaginal ring Place 1 each vaginally every 28 (twenty-eight) days. Insert vaginally and leave in place for 3 consecutive weeks, then remove for 1 week.     No current facility-administered medications on file prior to visit.     No Known Allergies  Family History  Problem Relation Age of Onset  . Thyroid disease Mother   . Hyperlipidemia Mother   . Hypertension Mother   . Hypertension Father   . Hyperlipidemia Father   . Diabetes Paternal Grandmother   . Diabetes Paternal Grandfather   . Cancer Maternal Grandmother        lung  . Hyperlipidemia Maternal Uncle   . Hypertension Maternal Uncle   . Hyperlipidemia Paternal Aunt   . Hypertension Paternal Aunt   . Hyperlipidemia Paternal Uncle    . Hypertension Paternal Uncle   . Heart attack Paternal Uncle   . Healthy Son        2    Social History   Socioeconomic History  . Marital status: Married    Spouse name: Baird LyonsCasey  . Number of children: 1  . Years of education: 4  . Highest education level: None  Social Needs  . Financial resource strain: None  . Food insecurity - worry: None  . Food insecurity - inability: None  . Transportation needs - medical: None  . Transportation needs - non-medical: None  Occupational History  . Occupation: Runner, broadcasting/film/videoTeacher  Tobacco Use  . Smoking status: Never Smoker  . Smokeless tobacco: Never Used  Substance and Sexual Activity  . Alcohol use: No    Alcohol/week: 0.0 oz  . Drug use: No  . Sexual activity: Yes    Partners: Male    Birth control/protection: Inserts    Comment: Nuvaring  Other Topics Concern  . None  Social History Narrative  . None   Review of Systems - See HPI.  All other ROS are negative.  BP 118/80   Pulse 77   Temp (!) 97.5 F (36.4 C) (Oral)   Resp 14   Ht 5\' 4"  (1.626 m)   Wt 196 lb (88.9 kg)   SpO2 98%   BMI 33.64 kg/m   Physical Exam  Constitutional: She is  oriented to person, place, and time and well-developed, well-nourished, and in no distress.  HENT:  Head: Normocephalic and atraumatic.  Eyes: Conjunctivae are normal.  Neck: Neck supple. No thyromegaly present.  Cardiovascular: Normal rate, regular rhythm, normal heart sounds and intact distal pulses.  Pulmonary/Chest: Effort normal and breath sounds normal. No respiratory distress. She has no wheezes. She has no rales. She exhibits no tenderness.  Neurological: She is alert and oriented to person, place, and time.  Skin: Skin is warm and dry. No rash noted.  Vitals reviewed.  Assessment/Plan: 1. Class 2 obesity without serious comorbidity in adult, unspecified BMI, unspecified obesity type Patient is to be commended on her diet and exercise efforts.  Encouraged her to continue with this.   We will check labs today to include thyroid panel.  Will alter regimen according to results.  If lab workup unremarkable, may consider weight loss medication. - TSH - T4, free  2. Hx of hypopituitarism Labs today to include TSH, free T4 and ACTH levels given reported history.  Patient endorses regular menstrual cycle so no need to check estradiol, FSH levels per up-to-date guidelines.  - TSH - T4, free - ACTH   Piedad Climes, PA-C

## 2017-05-09 LAB — ACTH: C206 ACTH: 15 pg/mL (ref 6–50)

## 2017-05-22 ENCOUNTER — Encounter: Payer: Self-pay | Admitting: Family Medicine

## 2017-05-22 ENCOUNTER — Other Ambulatory Visit: Payer: Self-pay

## 2017-05-22 ENCOUNTER — Ambulatory Visit: Payer: BC Managed Care – PPO | Admitting: Family Medicine

## 2017-05-22 VITALS — BP 118/70 | HR 62 | Temp 98.1°F | Ht 64.0 in | Wt 191.2 lb

## 2017-05-22 DIAGNOSIS — R358 Other polyuria: Secondary | ICD-10-CM | POA: Diagnosis not present

## 2017-05-22 DIAGNOSIS — R3915 Urgency of urination: Secondary | ICD-10-CM

## 2017-05-22 DIAGNOSIS — R3589 Other polyuria: Secondary | ICD-10-CM

## 2017-05-22 DIAGNOSIS — R350 Frequency of micturition: Secondary | ICD-10-CM | POA: Diagnosis not present

## 2017-05-22 LAB — POCT URINALYSIS DIPSTICK
Bilirubin, UA: NEGATIVE
Blood, UA: NEGATIVE
Glucose, UA: NEGATIVE
LEUKOCYTES UA: NEGATIVE
Nitrite, UA: NEGATIVE
ODOR: NEGATIVE
PROTEIN UA: NEGATIVE
Spec Grav, UA: 1.02 (ref 1.010–1.025)
Urobilinogen, UA: 0.2 E.U./dL
pH, UA: 5.5 (ref 5.0–8.0)

## 2017-05-22 LAB — POCT GLYCOSYLATED HEMOGLOBIN (HGB A1C): HEMOGLOBIN A1C: 5.4

## 2017-05-22 LAB — POCT CBG (FASTING - GLUCOSE)-MANUAL ENTRY: GLUCOSE FASTING, POC: 91 mg/dL (ref 70–99)

## 2017-05-22 NOTE — Progress Notes (Signed)
Subjective  CC:  Chief Complaint  Patient presents with  . Urinary Frequency    Going every 30 minutes     HPI: Amber Nguyen is a 32 y.o. female who presents to the office today to address the problems listed above in the chief complaint.  Healthy 32 yo c/o polyuria for last 4-6 weeks; goes ever 1-3 hours; difficult due to her job. Nocturia once at night if she drinks late. She reports that she has been drinking 80-100oz of water daily per the recommendations of her trainer. She thought she shouldn't be going to the br as much now since she should be "used to that amount now". Denies sxs of infection: dysuria, hematuria, pelvic pressure, abnormal appearing or smelling urine. She is concerned about her sugar. No dry mouth or polydipsia.   I reviewed the patients updated PMH, FH, and SocHx.    Patient Active Problem List   Diagnosis Date Noted  . History of cesarean section 05/29/2016  . Postpartum fever 11/03/2013  . Mild or unspecified pre-eclampsia, with delivery 10/17/2013  . S/P primary low transverse C-section 10/15/2013  . Morbid obesity (HCC) 10/13/2013  . Cholestasis of pregnancy 10/13/2013  . Elevated LFTs 10/13/2013   Current Meds  Medication Sig  . etonogestrel-ethinyl estradiol (NUVARING) 0.12-0.015 MG/24HR vaginal ring Place 1 each vaginally every 28 (twenty-eight) days. Insert vaginally and leave in place for 3 consecutive weeks, then remove for 1 week.    Allergies: Patient has No Known Allergies. Family History: Patient family history includes Cancer in her maternal grandmother; Diabetes in her paternal grandfather and paternal grandmother; Healthy in her son; Heart attack in her paternal uncle; Hyperlipidemia in her father, maternal uncle, mother, paternal aunt, and paternal uncle; Hypertension in her father, maternal uncle, mother, paternal aunt, and paternal uncle; Thyroid disease in her mother. Social History:  Patient  reports that she has never smoked.  She has never used smokeless tobacco. She reports that she does not drink alcohol or use drugs.  Review of Systems: Constitutional: Negative for fever malaise or anorexia Cardiovascular: negative for chest pain Respiratory: negative for SOB or persistent cough Gastrointestinal: negative for abdominal pain  Office Visit on 05/22/2017  Component Date Value Ref Range Status  . Color, UA 05/22/2017 yellow   Final  . Clarity, UA 05/22/2017 clear   Final  . Glucose, UA 05/22/2017 neg   Final  . Bilirubin, UA 05/22/2017 neg   Final  . Ketones, UA 05/22/2017 1+   Final  . Spec Grav, UA 05/22/2017 1.020  1.010 - 1.025 Final  . Blood, UA 05/22/2017 neg   Final  . pH, UA 05/22/2017 5.5  5.0 - 8.0 Final  . Protein, UA 05/22/2017 neg   Final  . Urobilinogen, UA 05/22/2017 0.2  0.2 or 1.0 E.U./dL Final  . Nitrite, UA 16/10/960403/21/2019 neg   Final  . Leukocytes, UA 05/22/2017 Negative  Negative Final  . Odor 05/22/2017 neg   Final  . Hemoglobin A1C 05/22/2017 5.4   Final  . Glucose Fasting, POC 05/22/2017 91  70 - 99 mg/dL Final    Objective  Vitals: BP 118/70   Pulse 62   Temp 98.1 F (36.7 C)   Ht 5\' 4"  (1.626 m)   Wt 191 lb 3.2 oz (86.7 kg)   LMP 04/29/2017   BMI 32.82 kg/m  General: no acute distress , A&Ox3 HEENT: PEERL, conjunctiva normal, Oropharynx moist,neck is supple Cardiovascular:  RRR without murmur or gallop.  Respiratory:  Good  breath sounds bilaterally, CTAB with normal respiratory effort Gastrointestinal: soft, flat abdomen, normal active bowel sounds, no palpable masses, no hepatosplenomegaly, no appreciated hernias Skin:  Warm, no rashes  Assessment  1. Urinary frequency   2. Urinary urgency   3. Polyuria      Plan   Urinary frequency:  Most c/w normal urination due to increased water intake. Reassured, sugar tests are normal. No sign of infection. Pt still skeptical: recommend decreasing water intake for a week to see how it affects her voiding patterns. F/u with pcp  as needed.   Follow up: Return if symptoms worsen or fail to improve.    Commons side effects, risks, benefits, and alternatives for medications and treatment plan prescribed today were discussed, and the patient expressed understanding of the given instructions. Patient is instructed to call or message via MyChart if he/she has any questions or concerns regarding our treatment plan. No barriers to understanding were identified. We discussed Red Flag symptoms and signs in detail. Patient expressed understanding regarding what to do in case of urgent or emergency type symptoms.   Medication list was reconciled, printed and provided to the patient in AVS. Patient instructions and summary information was reviewed with the patient as documented in the AVS. This note was prepared with assistance of Dragon voice recognition software. Occasional wrong-word or sound-a-like substitutions may have occurred due to the inherent limitations of voice recognition software  Orders Placed This Encounter  Procedures  . POCT urinalysis dipstick  . POCT glycosylated hemoglobin (Hb A1C)  . POCT CBG (Fasting - Glucose)   No orders of the defined types were placed in this encounter.

## 2017-05-26 ENCOUNTER — Encounter: Payer: Self-pay | Admitting: Family Medicine

## 2017-05-26 NOTE — Patient Instructions (Signed)
Please follow up if symptoms do not improve or as needed.   

## 2017-06-10 ENCOUNTER — Other Ambulatory Visit: Payer: Self-pay

## 2017-06-10 ENCOUNTER — Ambulatory Visit: Payer: BC Managed Care – PPO | Admitting: Physician Assistant

## 2017-06-10 ENCOUNTER — Encounter: Payer: Self-pay | Admitting: Physician Assistant

## 2017-06-10 VITALS — BP 122/80 | HR 93 | Temp 98.0°F | Resp 14 | Ht 64.0 in | Wt 187.0 lb

## 2017-06-10 DIAGNOSIS — R0982 Postnasal drip: Secondary | ICD-10-CM

## 2017-06-10 LAB — POCT RAPID STREP A (OFFICE): RAPID STREP A SCREEN: NEGATIVE

## 2017-06-10 NOTE — Progress Notes (Signed)
Patient presents to clinic today c/o scratchy throat and pnd starting last night. Patient endorses she was diagnosed with strep throat 11 days ago at [facility]. Was given penicillin as treatment. Notes symptoms of throat pain resolving until recurrence last night. Has had nasal congestion and sinus pressure without pain. Denies fever since recurrence of symptoms. Denies chest pain or SOB.    Past Medical History:  Diagnosis Date  . 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 Anxiety    situational  . Cholecystitis   . Heartburn during pregnancy   . Pre-eclampsia     Current Outpatient Medications on File Prior to Visit  Medication Sig Dispense Refill  . etonogestrel-ethinyl estradiol (NUVARING) 0.12-0.015 MG/24HR vaginal ring Place 1 each vaginally every 28 (twenty-eight) days. Insert vaginally and leave in place for 3 consecutive weeks, then remove for 1 week.     No current facility-administered medications on file prior to visit.     No Known Allergies  Family History  Problem Relation Age of Onset  . Thyroid disease Mother   . Hyperlipidemia Mother   . Hypertension Mother   . Hypertension Father   . Hyperlipidemia Father   . Diabetes Paternal Grandmother   . Diabetes Paternal Grandfather   . Cancer Maternal Grandmother        lung  . Hyperlipidemia Maternal Uncle   . Hypertension Maternal Uncle   . Hyperlipidemia Paternal Aunt   . Hypertension Paternal Aunt   . Hyperlipidemia Paternal Uncle   . Hypertension Paternal Uncle   . Heart attack Paternal Uncle   . Healthy Son        2    Social History   Socioeconomic History  . Marital status: Married    Spouse name: Baird Lyons  . Number of children: 1  . Years of education: 4  . Highest education level: Not on file  Occupational History  . Occupation: Runner, broadcasting/film/video  Social Needs  . Financial resource strain: Not on file  . Food insecurity:    Worry: Not on file    Inability: Not on file  . Transportation needs:    Medical: Not on file      Non-medical: Not on file  Tobacco Use  . Smoking status: Never Smoker  . Smokeless tobacco: Never Used  Substance and Sexual Activity  . Alcohol use: No    Alcohol/week: 0.0 oz  . Drug use: No  . Sexual activity: Yes    Partners: Male    Birth control/protection: Inserts    Comment: Nuvaring  Lifestyle  . Physical activity:    Days per week: Not on file    Minutes per session: Not on file  . Stress: Not on file  Relationships  . Social connections:    Talks on phone: Not on file    Gets together: Not on file    Attends religious service: Not on file    Active member of club or organization: Not on file    Attends meetings of clubs or organizations: Not on file    Relationship status: Not on file  Other Topics Concern  . Not on file  Social History Narrative  . Not on file   Review of Systems - See HPI.  All other ROS are negative.  BP 122/80   Pulse 93   Temp 98 F (36.7 C) (Oral)   Resp 14   Ht 5\' 4"  (1.626 m)   Wt 187 lb (84.8 kg)   SpO2 99%  BMI 32.10 kg/m   Physical Exam  Constitutional: She appears well-developed and well-nourished.  HENT:  Head: Normocephalic and atraumatic.  Right Ear: Tympanic membrane normal.  Left Ear: Tympanic membrane normal.  Nose: Rhinorrhea present. No mucosal edema.  Mouth/Throat: Oropharynx is clear and moist and mucous membranes are normal. No oropharyngeal exudate, posterior oropharyngeal edema, posterior oropharyngeal erythema or tonsillar abscesses. Tonsils are 0 on the right. Tonsils are 0 on the left. No tonsillar exudate.  Cardiovascular: Normal rate and regular rhythm.  Pulmonary/Chest: Effort normal. No stridor. No respiratory distress. She has no rhonchi.  Neurological: She is alert.  Skin: Skin is warm. No rash noted.  Vitals reviewed.  Recent Results (from the past 2160 hour(s))  ACTH     Status: None   Collection Time: 05/07/17  8:37 AM  Result Value Ref Range   C206 ACTH 15 6 - 50 pg/mL    Comment:  Reference range applies only to specimens collected between 7am-10am   TSH     Status: None   Collection Time: 05/07/17  8:38 AM  Result Value Ref Range   TSH 1.57 0.35 - 4.50 uIU/mL  T4, free     Status: None   Collection Time: 05/07/17  8:38 AM  Result Value Ref Range   Free T4 1.03 0.60 - 1.60 ng/dL    Comment: Specimens from patients who are undergoing biotin therapy and /or ingesting biotin supplements may contain high levels of biotin.  The higher biotin concentration in these specimens interferes with this Free T4 assay.  Specimens that contain high levels  of biotin may cause false high results for this Free T4 assay.  Please interpret results in light of the total clinical presentation of the patient.    POCT urinalysis dipstick     Status: None   Collection Time: 05/22/17  4:12 PM  Result Value Ref Range   Color, UA yellow    Clarity, UA clear    Glucose, UA neg    Bilirubin, UA neg    Ketones, UA 1+    Spec Grav, UA 1.020 1.010 - 1.025   Blood, UA neg    pH, UA 5.5 5.0 - 8.0   Protein, UA neg    Urobilinogen, UA 0.2 0.2 or 1.0 E.U./dL   Nitrite, UA neg    Leukocytes, UA Negative Negative   Appearance     Odor neg   POCT CBG (Fasting - Glucose)     Status: Normal   Collection Time: 05/22/17  4:34 PM  Result Value Ref Range   Glucose Fasting, POC 91 70 - 99 mg/dL  POCT glycosylated hemoglobin (Hb A1C)     Status: Normal   Collection Time: 05/22/17  4:35 PM  Result Value Ref Range   Hemoglobin A1C 5.4    Assessment/Plan: 1. Post-nasal drip Causing sore throat. + nasal congestion. Negative strep test. No sign of strep on examination. Will start Flonase, saline rinse and oral antihistamine. Supportive measures reviewed. Follow-up if not improving.    Piedad ClimesWilliam Cody Sylvan Sookdeo, PA-C

## 2017-06-10 NOTE — Patient Instructions (Signed)
Strep testing is negative. Hydrate well and get plenty of rest.  Start over the counter Saline nasal rinse and Flonase. Also start Claritin once daily to help with allergic inflammation. Put a humidifier in the bedroom.  Call me if you note any worsening symptoms or new symptoms, such as fever.

## 2018-10-05 LAB — HM PAP SMEAR: HM Pap smear: NORMAL

## 2018-10-06 ENCOUNTER — Other Ambulatory Visit: Payer: Self-pay

## 2018-10-06 ENCOUNTER — Encounter: Payer: Self-pay | Admitting: Physician Assistant

## 2018-10-06 ENCOUNTER — Ambulatory Visit (INDEPENDENT_AMBULATORY_CARE_PROVIDER_SITE_OTHER): Payer: BC Managed Care – PPO | Admitting: Physician Assistant

## 2018-10-06 VITALS — BP 110/80 | HR 66 | Temp 99.1°F | Resp 14 | Ht 64.0 in | Wt 195.0 lb

## 2018-10-06 DIAGNOSIS — E669 Obesity, unspecified: Secondary | ICD-10-CM | POA: Diagnosis not present

## 2018-10-06 DIAGNOSIS — Z0001 Encounter for general adult medical examination with abnormal findings: Secondary | ICD-10-CM

## 2018-10-06 DIAGNOSIS — F329 Major depressive disorder, single episode, unspecified: Secondary | ICD-10-CM

## 2018-10-06 DIAGNOSIS — Z0000 Encounter for general adult medical examination without abnormal findings: Secondary | ICD-10-CM

## 2018-10-06 DIAGNOSIS — F419 Anxiety disorder, unspecified: Secondary | ICD-10-CM | POA: Diagnosis not present

## 2018-10-06 LAB — COMPREHENSIVE METABOLIC PANEL
ALT: 11 U/L (ref 0–35)
AST: 10 U/L (ref 0–37)
Albumin: 4.7 g/dL (ref 3.5–5.2)
Alkaline Phosphatase: 63 U/L (ref 39–117)
BUN: 12 mg/dL (ref 6–23)
CO2: 25 mEq/L (ref 19–32)
Calcium: 9.9 mg/dL (ref 8.4–10.5)
Chloride: 106 mEq/L (ref 96–112)
Creatinine, Ser: 0.88 mg/dL (ref 0.40–1.20)
GFR: 74.39 mL/min (ref >60.00)
Glucose, Bld: 92 mg/dL (ref 70–99)
Potassium: 4.4 mEq/L (ref 3.5–5.1)
Sodium: 139 mEq/L (ref 135–145)
Total Bilirubin: 0.5 mg/dL (ref 0.2–1.2)
Total Protein: 7.4 g/dL (ref 6.0–8.3)

## 2018-10-06 LAB — CBC WITH DIFFERENTIAL/PLATELET
Basophils Absolute: 0 10*3/uL (ref 0.0–0.1)
Basophils Relative: 0.2 % (ref 0.0–3.0)
Eosinophils Absolute: 0.1 10*3/uL (ref 0.0–0.7)
Eosinophils Relative: 1.3 % (ref 0.0–5.0)
HCT: 40.3 % (ref 36.0–46.0)
Hemoglobin: 13.4 g/dL (ref 12.0–15.0)
Lymphocytes Relative: 31.8 % (ref 12.0–46.0)
Lymphs Abs: 2.2 10*3/uL (ref 0.7–4.0)
MCHC: 33.3 g/dL (ref 30.0–36.0)
MCV: 94.9 fl (ref 78.0–100.0)
Monocytes Absolute: 0.3 10*3/uL (ref 0.1–1.0)
Monocytes Relative: 4 % (ref 3.0–12.0)
Neutro Abs: 4.4 10*3/uL (ref 1.4–7.7)
Neutrophils Relative %: 62.7 % (ref 43.0–77.0)
Platelets: 273 10*3/uL (ref 150.0–400.0)
RBC: 4.24 Mil/uL (ref 3.87–5.11)
RDW: 13.5 % (ref 11.5–15.5)
WBC: 7 10*3/uL (ref 4.0–10.5)

## 2018-10-06 LAB — LIPID PANEL
Cholesterol: 219 mg/dL — ABNORMAL HIGH (ref 0–200)
HDL: 52.1 mg/dL (ref >39.00)
LDL Cholesterol: 146 mg/dL — ABNORMAL HIGH (ref 0–99)
NonHDL: 167.32
Total CHOL/HDL Ratio: 4
Triglycerides: 107 mg/dL (ref 0.0–149.0)
VLDL: 21.4 mg/dL (ref 0.0–40.0)

## 2018-10-06 LAB — HEMOGLOBIN A1C: Hgb A1c MFr Bld: 5.4 % (ref 4.6–6.5)

## 2018-10-06 LAB — TSH: TSH: 2.38 u[IU]/mL (ref 0.35–4.50)

## 2018-10-06 MED ORDER — CITALOPRAM HYDROBROMIDE 10 MG PO TABS: 10.0000 mg | ORAL_TABLET | Freq: Every day | ORAL | 1 refills | Status: DC

## 2018-10-06 NOTE — Patient Instructions (Signed)
Please go to the lab for blood work.   Our office will call you with your results unless you have chosen to receive results via MyChart.  If your blood work is normal we will follow-up each year for physicals and as scheduled for chronic medical problems.  If anything is abnormal we will treat accordingly and get you in for a follow-up.  Please take the Citalopram 10 mg daily. Consider downloading the Calm, [place] or HeadSpace app on your phone.  Use the handout given to schedule an appointment with counseling.  Follow-up with me via Video visit in 4 weeks. Follow-up sooner if needed.   Preventive Care 89-31 Years Old, Female Preventive care refers to visits with your health care provider and lifestyle choices that can promote health and wellness. This includes:  A yearly physical exam. This may also be called an annual well check.  Regular dental visits and eye exams.  Immunizations.  Screening for certain conditions.  Healthy lifestyle choices, such as eating a healthy diet, getting regular exercise, not using drugs or products that contain nicotine and tobacco, and limiting alcohol use. What can I expect for my preventive care visit? Physical exam Your health care provider will check your:  Height and weight. This may be used to calculate body mass index (BMI), which tells if you are at a healthy weight.  Heart rate and blood pressure.  Skin for abnormal spots. Counseling Your health care provider may ask you questions about your:  Alcohol, tobacco, and drug use.  Emotional well-being.  Home and relationship well-being.  Sexual activity.  Eating habits.  Work and work Statistician.  Method of birth control.  Menstrual cycle.  Pregnancy history. What immunizations do I need?  Influenza (flu) vaccine  This is recommended every year. Tetanus, diphtheria, and pertussis (Tdap) vaccine  You may need a Td booster every 10 years. Varicella (chickenpox)  vaccine  You may need this if you have not been vaccinated. Human papillomavirus (HPV) vaccine  If recommended by your health care provider, you may need three doses over 6 months. Measles, mumps, and rubella (MMR) vaccine  You may need at least one dose of MMR. You may also need a second dose. Meningococcal conjugate (MenACWY) vaccine  One dose is recommended if you are age 30-21 years and a first-year college student living in a residence hall, or if you have one of several medical conditions. You may also need additional booster doses. Pneumococcal conjugate (PCV13) vaccine  You may need this if you have certain conditions and were not previously vaccinated. Pneumococcal polysaccharide (PPSV23) vaccine  You may need one or two doses if you smoke cigarettes or if you have certain conditions. Hepatitis A vaccine  You may need this if you have certain conditions or if you travel or work in places where you may be exposed to hepatitis A. Hepatitis B vaccine  You may need this if you have certain conditions or if you travel or work in places where you may be exposed to hepatitis B. Haemophilus influenzae type b (Hib) vaccine  You may need this if you have certain conditions. You may receive vaccines as individual doses or as more than one vaccine together in one shot (combination vaccines). Talk with your health care provider about the risks and benefits of combination vaccines. What tests do I need?  Blood tests  Lipid and cholesterol levels. These may be checked every 5 years starting at age 23.  Hepatitis C test.  Hepatitis B  test. Screening  Diabetes screening. This is done by checking your blood sugar (glucose) after you have not eaten for a while (fasting).  Sexually transmitted disease (STD) testing.  BRCA-related cancer screening. This may be done if you have a family history of breast, ovarian, tubal, or peritoneal cancers.  Pelvic exam and Pap test. This may be  done every 3 years starting at age 42. Starting at age 62, this may be done every 5 years if you have a Pap test in combination with an HPV test. Talk with your health care provider about your test results, treatment options, and if necessary, the need for more tests. Follow these instructions at home: Eating and drinking   Eat a diet that includes fresh fruits and vegetables, whole grains, lean protein, and low-fat dairy.  Take vitamin and mineral supplements as recommended by your health care provider.  Do not drink alcohol if: ? Your health care provider tells you not to drink. ? You are pregnant, may be pregnant, or are planning to become pregnant.  If you drink alcohol: ? Limit how much you have to 0-1 drink a day. ? Be aware of how much alcohol is in your drink. In the [place] one drink equals one 12 oz bottle of beer (355 mL), one 5 oz glass of wine (148 mL), or one 1 oz glass of hard liquor (44 mL). Lifestyle  Take daily care of your teeth and gums.  Stay active. Exercise for at least 30 minutes on 5 or more days each week.  Do not use any products that contain nicotine or tobacco, such as cigarettes, e-cigarettes, and chewing tobacco. If you need help quitting, ask your health care provider.  If you are sexually active, practice safe sex. Use a condom or other form of birth control (contraception) in order to prevent pregnancy and STIs (sexually transmitted infections). If you plan to become pregnant, see your health care provider for a preconception visit. What's next?  Visit your health care provider once a year for a well check visit.  Ask your health care provider how often you should have your eyes and teeth checked.  Stay up to date on all vaccines. This information is not intended to replace advice given to you by your health care provider. Make sure you discuss any questions you have with your health care provider. Document Released: 04/16/2001 Document Revised:  10/30/2017 Document Reviewed: 10/30/2017 Elsevier Patient Education  2020 [employer].

## 2018-10-06 NOTE — Progress Notes (Signed)
Patient presents to clinic today for annual exam.  Patient is fasting for labs.  Acute Concerns: Patient with noted history of anxiety, mainly situational.  Has always been able to handle this on her own previously.  Over the past few months has noted significant increase in anxiety as well as some periods of anhedonia and depressed mood.  Notes significant increase in work stressors.  Also having quite an issue at home with her marriage.  Denies change in sleep.  Does note increase in intake.  Denies suicidal thought or ideation.  Would like to discuss options for treatment today.   Health Maintenance: Immunizations -- UTD PAP --followed by GYN.  Had Pap smear yesterday.  Results pending.  Past Medical History:  Diagnosis Date  . 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 Anxiety    situational  . Cholecystitis   . Heartburn during pregnancy   . Pre-eclampsia     Past Surgical History:  Procedure Laterality Date  . CESAREAN SECTION N/A 10/15/2013   Procedure: PRIMARY CESAREAN SECTION;  Surgeon: Purcell NailsAngela Y Roberts, MD;  Location: [facility];  Service: Obstetrics;  Laterality: N/A;  . FRACTURE SURGERY     left wrist  . TENDON REPAIR     right hand  . tubes in ears      Current Outpatient Medications on File Prior to Visit  Medication Sig Dispense Refill  . etonogestrel-ethinyl estradiol (NUVARING) 0.12-0.015 MG/24HR vaginal ring Place 1 each vaginally every 28 (twenty-eight) days. Insert vaginally and leave in place for 3 consecutive weeks, then remove for 1 week.     No current facility-administered medications on file prior to visit.     No Known Allergies  Family History  Problem Relation Age of Onset  . Thyroid disease Mother   . Hyperlipidemia Mother   . Hypertension Mother   . Hypertension Father   . Hyperlipidemia Father   . Diabetes Paternal Grandmother   . Diabetes Paternal Grandfather   . Cancer Maternal Grandmother        lung  . Hyperlipidemia Maternal Uncle   .  Hypertension Maternal Uncle   . Hyperlipidemia Paternal Aunt   . Hypertension Paternal Aunt   . Hyperlipidemia Paternal Uncle   . Hypertension Paternal Uncle   . Heart attack Paternal Uncle   . Healthy Son        2    Social History   Socioeconomic History  . Marital status: Married    Spouse name: Baird LyonsCasey  . Number of children: 1  . Years of education: 4  . Highest education level: Not on file  Occupational History  . Occupation: Runner, broadcasting/film/videoTeacher  Social Needs  . Financial resource strain: Not on file  . Food insecurity    Worry: Not on file    Inability: Not on file  . Transportation needs    Medical: Not on file    Non-medical: Not on file  Tobacco Use  . Smoking status: Never Smoker  . Smokeless tobacco: Never Used  Substance and Sexual Activity  . Alcohol use: No  . Drug use: No  . Sexual activity: Yes    Partners: Male    Birth control/protection: Inserts    Comment: Nuvaring  Lifestyle  . Physical activity    Days per week: Not on file    Minutes per session: Not on file  . Stress: Not on file  Relationships  . Social connections    Talks on phone: Not on file  Gets together: Not on file    Attends religious service: Not on file    Active member of club or organization: Not on file    Attends meetings of clubs or organizations: Not on file    Relationship status: Not on file  . Intimate partner violence    Fear of current or ex partner: Not on file    Emotionally abused: Not on file    Physically abused: Not on file    Forced sexual activity: Not on file  Other Topics Concern  . Not on file  Social History Narrative  . Not on file    Review of Systems  Constitutional: Negative for fever and weight loss.  HENT: Negative for ear discharge, ear pain, hearing loss and tinnitus.   Eyes: Negative for blurred vision, double vision, photophobia and pain.  Respiratory: Negative for cough and shortness of breath.   Cardiovascular: Negative for chest pain and  palpitations.  Gastrointestinal: Negative for abdominal pain, blood in stool, constipation, diarrhea, heartburn, melena, nausea and vomiting.  Genitourinary: Negative for dysuria, flank pain, frequency, hematuria and urgency.  Musculoskeletal: Negative for falls.  Neurological: Negative for dizziness, loss of consciousness and headaches.  Endo/Heme/Allergies: Negative for environmental allergies.  Psychiatric/Behavioral: Positive for depression. Negative for hallucinations, substance abuse and suicidal ideas. The patient is nervous/anxious. The patient does not have insomnia.    BP 110/80   Pulse 66   Temp 99.1 F (37.3 C) (Skin)   Resp 14   Ht 5\' 4"  (1.626 m)   Wt 195 lb (88.5 kg)   SpO2 99%   BMI 33.47 kg/m   Physical Exam Vitals signs reviewed.  HENT:     Head: Normocephalic and atraumatic.     Right Ear: Tympanic membrane, ear canal and external ear normal.     Left Ear: Tympanic membrane, ear canal and external ear normal.     Nose: Nose normal. No mucosal edema.     Mouth/Throat:     Pharynx: Uvula midline. No oropharyngeal exudate or posterior oropharyngeal erythema.  Eyes:     Conjunctiva/sclera: Conjunctivae normal.     Pupils: Pupils are equal, round, and reactive to light.  Neck:     Musculoskeletal: Neck supple.     Thyroid: No thyromegaly.  Cardiovascular:     Rate and Rhythm: Normal rate and regular rhythm.     Heart sounds: Normal heart sounds.  Pulmonary:     Effort: Pulmonary effort is normal. No respiratory distress.     Breath sounds: Normal breath sounds. No wheezing or rales.  Abdominal:     General: Bowel sounds are normal. There is no distension.     Palpations: Abdomen is soft. There is no mass.     Tenderness: There is no abdominal tenderness. There is no guarding or rebound.  Lymphadenopathy:     Cervical: No cervical adenopathy.  Skin:    General: Skin is warm and dry.     Findings: No rash.  Neurological:     Mental Status: She is alert  and oriented to person, place, and time.     Cranial Nerves: No cranial nerve deficit.    Assessment/Plan: 1. Visit for preventive health examination Depression screen negative. Health Maintenance reviewed --Pap completed yesterday as noted.  Will await records from GYN.. Preventive schedule discussed and handout given in AVS. Will obtain fasting labs today. - CBC with Differential/Platelet - Comprehensive metabolic panel - Hemoglobin A1c - Lipid panel - TSH  2. Anxiety and  depression Discussed treatment options including counseling and pharmacotherapy.  Handout on counseling services given to patient.  She is going to call and schedule an appointment.  We will also start citalopram 10 mg once daily.  Potential side effects reviewed with patient.  Plan on follow-up via video visit in 4 weeks.  Return to clinic sooner if needed. - citalopram (CELEXA) 10 MG tablet; Take 1 tablet (10 mg total) by mouth daily.  Dispense: 30 tablet; Refill: 1  3. Obesity (BMI 30-39.9) Dietary and exercise recommendations reviewed.  She is going to work on this.  Repeat fasting labs today.   Leeanne Rio, PA-C

## 2018-10-07 ENCOUNTER — Encounter: Payer: Self-pay | Admitting: Emergency Medicine

## 2018-10-22 ENCOUNTER — Encounter: Payer: Self-pay | Admitting: Physician Assistant

## 2018-10-28 ENCOUNTER — Other Ambulatory Visit: Payer: Self-pay | Admitting: Physician Assistant

## 2018-10-28 DIAGNOSIS — F329 Major depressive disorder, single episode, unspecified: Secondary | ICD-10-CM

## 2018-10-28 DIAGNOSIS — F419 Anxiety disorder, unspecified: Secondary | ICD-10-CM

## 2018-11-06 ENCOUNTER — Other Ambulatory Visit: Payer: Self-pay

## 2018-11-06 ENCOUNTER — Ambulatory Visit (INDEPENDENT_AMBULATORY_CARE_PROVIDER_SITE_OTHER): Payer: BC Managed Care – PPO | Admitting: Physician Assistant

## 2018-11-06 ENCOUNTER — Encounter: Payer: Self-pay | Admitting: Physician Assistant

## 2018-11-06 VITALS — BP 133/80 | HR 74 | Temp 98.3°F

## 2018-11-06 DIAGNOSIS — F329 Major depressive disorder, single episode, unspecified: Secondary | ICD-10-CM

## 2018-11-06 DIAGNOSIS — F419 Anxiety disorder, unspecified: Secondary | ICD-10-CM

## 2018-11-06 DIAGNOSIS — F32A Depression, unspecified: Secondary | ICD-10-CM

## 2018-11-06 MED ORDER — CITALOPRAM HYDROBROMIDE 20 MG PO TABS: 20.0000 mg | ORAL_TABLET | Freq: Every day | ORAL | 3 refills | Status: DC

## 2018-11-06 NOTE — Patient Instructions (Signed)
Instructions sent to MyChart.  Please increase to the new dose of Citalopram (20 mg daily). Ok to take 2 of the 10 mg tablets for now until you run out but I have sent in a script for the 20 mg tablets to the pharmacy  Keep me updated in the next few weeks with how you are doing. If things are improving we will follow-up in 3 months (in-office or video visit) If you note any new or worsening symptoms, call me ASAP.  Hang in there!

## 2018-11-06 NOTE — Progress Notes (Signed)
I have discussed the procedure for the virtual visit with the patient who has given consent to proceed with assessment and treatment.   Brinn Westby S Kingdom Vanzanten, CMA     

## 2018-11-06 NOTE — Progress Notes (Signed)
   Virtual Visit via Video   I connected with patient on 11/06/18 at  4:00 PM EDT by a video enabled telemedicine application and verified that I am speaking with the correct person using two identifiers.  Location patient: Home Location provider: [facility], Office Persons participating in the virtual visit: Patient, Provider, [place] (Patina Moore)  I discussed the limitations of evaluation and management by telemedicine and the availability of in person appointments. The patient expressed understanding and agreed to proceed.  Subjective:   HPI:   Patient presents via Doxy.Me for follow-up of anxiety and depression after starting Citalopram 10 mg once daily. Endorses taking medications as directed and tolerating well but not noting much of an improvement yet. Sleep and appetite are stable. Denies any new or worsening symptoms. Denies SI/HI.   ROS:   See pertinent positives and negatives per HPI.  Patient Active Problem List   Diagnosis Date Noted  . History of cesarean section 05/29/2016  . Postpartum fever 11/03/2013  . Mild or unspecified pre-eclampsia, with delivery 10/17/2013  . S/P primary low transverse C-section 10/15/2013  . Morbid obesity ([place]) 10/13/2013  . Cholestasis of pregnancy 10/13/2013  . Elevated LFTs 10/13/2013    Social History   Tobacco Use  . Smoking status: Never Smoker  . Smokeless tobacco: Never Used  Substance Use Topics  . Alcohol use: No    Current Outpatient Medications:  .  etonogestrel-ethinyl estradiol (NUVARING) 0.12-0.015 MG/24HR vaginal ring, Place 1 each vaginally every 28 (twenty-eight) days. Insert vaginally and leave in place for 3 consecutive weeks, then remove for 1 week., Disp: , Rfl:  .  citalopram (CELEXA) 20 MG tablet, Take 1 tablet (20 mg total) by mouth daily., Disp: 30 tablet, Rfl: 3  No Known Allergies  Objective:   BP 133/80   Pulse 74   Temp 98.3 F (36.8 C) (Tympanic)   Patient is well-developed,  well-nourished in no acute distress.  Resting comfortably at home.  Head is normocephalic, atraumatic.  No labored breathing.  Speech is clear and coherent with logical content.  Patient is alert and oriented at baseline.   Assessment and Plan:   1. Anxiety and depression Increase Citalopram to 20 mg QD. Follow-up via MyChart in 3-4 weeks to let me know how she is doing. As long as things improving, will follow-up in office or via Video Visit in 3 months.  - citalopram (CELEXA) 20 MG tablet; Take 1 tablet (20 mg total) by mouth daily.  Dispense: 30 tablet; Refill: [place], [place] 11/06/2018

## 2018-12-02 ENCOUNTER — Other Ambulatory Visit: Payer: Self-pay | Admitting: Physician Assistant

## 2018-12-02 DIAGNOSIS — F32A Depression, unspecified: Secondary | ICD-10-CM

## 2018-12-02 DIAGNOSIS — F329 Major depressive disorder, single episode, unspecified: Secondary | ICD-10-CM

## 2019-03-05 ENCOUNTER — Other Ambulatory Visit: Payer: Self-pay | Admitting: Physician Assistant

## 2019-03-05 DIAGNOSIS — F329 Major depressive disorder, single episode, unspecified: Secondary | ICD-10-CM

## 2019-03-05 DIAGNOSIS — F419 Anxiety disorder, unspecified: Secondary | ICD-10-CM

## 2019-03-08 ENCOUNTER — Encounter: Payer: Self-pay | Admitting: Emergency Medicine

## 2019-03-08 NOTE — Telephone Encounter (Signed)
My chart message sent to patient to schedule a video visit for follow up

## 2019-06-07 ENCOUNTER — Other Ambulatory Visit: Payer: Self-pay | Admitting: Physician Assistant

## 2019-06-07 DIAGNOSIS — F419 Anxiety disorder, unspecified: Secondary | ICD-10-CM

## 2019-06-07 DIAGNOSIS — F329 Major depressive disorder, single episode, unspecified: Secondary | ICD-10-CM

## 2019-07-01 ENCOUNTER — Other Ambulatory Visit: Payer: Self-pay | Admitting: Physician Assistant

## 2019-07-01 DIAGNOSIS — F329 Major depressive disorder, single episode, unspecified: Secondary | ICD-10-CM

## 2019-07-01 DIAGNOSIS — F419 Anxiety disorder, unspecified: Secondary | ICD-10-CM

## 2020-01-01 ENCOUNTER — Other Ambulatory Visit: Payer: Self-pay

## 2020-01-01 ENCOUNTER — Telehealth: Payer: Self-pay | Admitting: Unknown Physician Specialty

## 2020-01-01 ENCOUNTER — Other Ambulatory Visit (HOSPITAL_COMMUNITY): Payer: Self-pay | Admitting: Family

## 2020-01-01 ENCOUNTER — Encounter: Payer: Self-pay | Admitting: Family Medicine

## 2020-01-01 ENCOUNTER — Telehealth (INDEPENDENT_AMBULATORY_CARE_PROVIDER_SITE_OTHER): Payer: BC Managed Care – PPO | Admitting: Family Medicine

## 2020-01-01 ENCOUNTER — Telehealth: Payer: Self-pay | Admitting: Family Medicine

## 2020-01-01 VITALS — HR 107 | Ht 64.0 in | Wt 195.0 lb

## 2020-01-01 DIAGNOSIS — U071 COVID-19: Secondary | ICD-10-CM | POA: Diagnosis not present

## 2020-01-01 DIAGNOSIS — E669 Obesity, unspecified: Secondary | ICD-10-CM

## 2020-01-01 HISTORY — DX: COVID-19: U07.1

## 2020-01-01 NOTE — Telephone Encounter (Signed)
Called to Discuss with patient about Covid symptoms and the use of the monoclonal antibody infusion for those with mild to moderate Covid symptoms and at a high risk of hospitalization.     Pt appears to qualify for this infusion due to co-morbid conditions and/or a member of an at-risk group in accordance with the FDA Emergency Use Authorization.    Unable to reach pt    

## 2020-01-01 NOTE — Assessment & Plan Note (Addendum)
First day of symptoms 2 days ago. Tested positive yesterday - result in chart. Would benefit from and interested in mAb infusion treatment due to BMI - I have phoned demographic information to infusion team hotline. Continue supportive care for now with zinc, vit C, vit D. Discussed self quarantine and self isolation at home separate from husband and 33yo son. Reviewed red flags to seek in-person care. Letter for work sent via [employer].  Will place on COVID call list to keep check on her.

## 2020-01-01 NOTE — Telephone Encounter (Signed)
Pt tested positive for covid on 12/31/2019, first day of symptoms was 10/28. Referred for mAab infusion treatment.  Please place on COVID call list and check in on her Monday for update on symptoms.

## 2020-01-01 NOTE — Progress Notes (Signed)
I connected by phone with Amber Nguyen on 01/01/2020 at 7:21 PM to discuss the potential use of a new treatment for mild to moderate COVID-19 viral infection in non-hospitalized patients.  This patient is a 34 y.o. female that meets the FDA criteria for Emergency Use Authorization of COVID monoclonal antibody casirivimab/imdevimab or bamlanivimab/eteseviamb.  Has a (+) direct SARS-CoV-2 viral test result  Has mild or moderate COVID-19   Is NOT hospitalized due to COVID-19  Is within 10 days of symptom onset  Has at least one of the high risk factor(s) for progression to severe COVID-19 and/or hospitalization as defined in EUA.  Specific high risk criteria : BMI > 25   Symptoms of H/A, fatigue, and cough began 12/30/19.   I have spoken and communicated the following to the patient or parent/caregiver regarding COVID monoclonal antibody treatment:  1. FDA has authorized the emergency use for the treatment of mild to moderate COVID-19 in adults and pediatric patients with positive results of direct SARS-CoV-2 viral testing who are 56 years of age and older weighing at least 40 kg, and who are at high risk for progressing to severe COVID-19 and/or hospitalization.  2. The significant known and potential risks and benefits of COVID monoclonal antibody, and the extent to which such potential risks and benefits are unknown.  3. Information on available alternative treatments and the risks and benefits of those alternatives, including clinical trials.  4. Patients treated with COVID monoclonal antibody should continue to self-isolate and use infection control measures (e.g., wear mask, isolate, social distance, avoid sharing personal items, clean and disinfect "high touch" surfaces, and frequent handwashing) according to CDC guidelines.   5. The patient or parent/caregiver has the option to accept or refuse COVID monoclonal antibody treatment.  After reviewing this information with the  patient, the patient has agreed to receive one of the available covid 19 monoclonal antibodies and will be provided an appropriate fact sheet prior to infusion. Morton Stall, NP 01/01/2020 7:21 PM

## 2020-01-01 NOTE — Progress Notes (Signed)
Virtual visit completed through MyChart, a video enabled telemedicine application. Due to national recommendations of social distancing due to COVID-19, a virtual visit is felt to be most appropriate for this patient at this time. Reviewed limitations, risks, security and privacy concerns of performing a virtual visit and the availability of in person appointments. I also reviewed that there may be a patient responsible charge related to this service. The patient agreed to proceed.   Patient location: home Provider location: [place] [facility], office Persons participating in this virtual visit: patient, provider   If any vitals were documented, they were collected by patient at home unless specified below.    Pulse (!) 107   Ht 5\' 4"  (1.626 m)   Wt 195 lb (88.5 kg)   SpO2 96%   BMI 33.47 kg/m    CC: COVID infection Subjective:    Patient ID: , female    DOB: September 07, 1985, 34 y.o.   MRN: [id number]  HPI: Amber Nguyen is a 34 y.o. female presenting on 01/01/2020 for Covid Positive (12/31/2019 antibody infusion questions ) and Headache (with body ache )   Tested positive yesterday 12/31/2019 for COVID-19 infection through [facility].   Thursday 2d ago started feeling ill with body aches, HA. Feels feverish, fatigue. Cough with exertion and deep breathing.   Denies dyspnea, wheezing, loss of taste or smell, abd pain, nausea, diarrhea.   Managing symptoms with dayquil, nyquil, vit C, D and zinc.   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   No h/o asthma. No smokers at home.  Has not been vaccinated.      Relevant past medical, surgical, family and social history reviewed and updated as indicated. Interim medical history since our last visit reviewed. Allergies and medications reviewed and updated. Outpatient Medications Prior to Visit  Medication Sig Dispense Refill  . citalopram (CELEXA) 20 MG tablet Take 1 tablet (20 mg  total) by mouth daily. Please schedule a appointment for follow up (Patient not taking: Reported on 01/01/2020) 30 tablet 0  . etonogestrel-ethinyl estradiol (NUVARING) 0.12-0.015 MG/24HR vaginal ring Place 1 each vaginally every 28 (twenty-eight) days. Insert vaginally and leave in place for 3 consecutive weeks, then remove for 1 week. (Patient not taking: Reported on 01/01/2020)     No facility-administered medications prior to visit.     Per HPI unless specifically indicated in ROS section below Review of Systems Objective:  Pulse (!) 107   Ht 5\' 4"  (1.626 m)   Wt 195 lb (88.5 kg)   SpO2 96%   BMI 33.47 kg/m   Wt Readings from Last 3 Encounters:  01/01/20 195 lb (88.5 kg)  10/06/18 195 lb (88.5 kg)  06/10/17 187 lb (84.8 kg)       Physical exam: Gen: alert, NAD, not ill appearing Pulm: speaks in complete sentences without increased work of breathing Psych: normal mood, normal thought content      Assessment & Plan:   Problem List Items Addressed This Visit    Obesity, Class I, BMI 30-34.9   COVID-19 virus infection - Primary    First day of symptoms 2 days ago. Tested positive yesterday - result in chart. Would benefit from and interested in mAb infusion treatment due to BMI - I have phoned demographic information to infusion team hotline. Continue supportive care for now with zinc, vit C, vit D. Discussed self quarantine and self isolation at home separate from husband and 6yo  son. Reviewed red flags to seek in-person care. Letter for work sent via [employer].  Will place on COVID call list to keep check on her.           No orders of the defined types were placed in this encounter.  No orders of the defined types were placed in this encounter.   I discussed the assessment and treatment plan with the patient. The patient was provided an opportunity to ask questions and all were answered. The patient agreed with the plan and demonstrated an understanding of the  instructions. The patient was advised to call back or seek an in-person evaluation if the symptoms worsen or if the condition fails to improve as anticipated.  Follow up plan: No follow-ups on file.  Eustaquio Boyden, MD

## 2020-01-03 ENCOUNTER — Ambulatory Visit (HOSPITAL_COMMUNITY)
Admission: RE | Admit: 2020-01-03 | Discharge: 2020-01-03 | Disposition: A | Discharge status: Home / Self Care | Payer: BC Managed Care – PPO | Source: Ambulatory Visit | Attending: Pulmonary Disease | Admitting: Pulmonary Disease

## 2020-01-03 ENCOUNTER — Other Ambulatory Visit (HOSPITAL_COMMUNITY): Payer: Self-pay

## 2020-01-03 DIAGNOSIS — U071 COVID-19: Secondary | ICD-10-CM | POA: Insufficient documentation

## 2020-01-03 MED ORDER — FAMOTIDINE IN NACL 20-0.9 MG/50ML-% IV SOLN: 20.0000 mg | Freq: Once | INTRAVENOUS | Status: DC | PRN

## 2020-01-03 MED ORDER — SODIUM CHLORIDE 0.9 % IV SOLN: INTRAVENOUS | Status: DC | PRN

## 2020-01-03 MED ORDER — METHYLPREDNISOLONE SODIUM SUCC 125 MG IJ SOLR: 125.0000 mg | Freq: Once | INTRAMUSCULAR | Status: DC | PRN

## 2020-01-03 MED ORDER — SOTROVIMAB 500 MG/8ML IV SOLN
500.0000 mg | Freq: Once | INTRAVENOUS | Status: AC
  Administered 2020-01-03: 500 mg via INTRAVENOUS

## 2020-01-03 MED ORDER — EPINEPHRINE 0.3 MG/0.3ML IJ SOAJ: 0.3000 mg | Freq: Once | INTRAMUSCULAR | Status: DC | PRN

## 2020-01-03 MED ORDER — ALBUTEROL SULFATE HFA 108 (90 BASE) MCG/ACT IN AERS: 2.0000 | INHALATION_SPRAY | Freq: Once | RESPIRATORY_TRACT | Status: DC | PRN

## 2020-01-03 MED ORDER — SODIUM CHLORIDE 0.9 % IV SOLN: Freq: Once | INTRAVENOUS | Status: DC

## 2020-01-03 MED ORDER — DIPHENHYDRAMINE HCL 50 MG/ML IJ SOLN: 50.0000 mg | Freq: Once | INTRAMUSCULAR | Status: DC | PRN

## 2020-01-03 NOTE — Telephone Encounter (Signed)
Noted  

## 2020-01-03 NOTE — Telephone Encounter (Signed)
Spoke with pt asking for an update on sxs.  States she sill has HA and mild body aches but seems to be improving.

## 2020-01-03 NOTE — Telephone Encounter (Signed)
Infusion appt scheduled for today.  plz call again Wed or Thurs for update on symptoms.  Thank you.

## 2020-01-03 NOTE — Progress Notes (Signed)
  Diagnosis: COVID-19  Physician: Patrick Wright, MD  Procedure: Sotrovimab  Complications: No immediate complications noted.  Discharge: Discharged home   Megumi Treaster N Rori Goar 01/03/2020   

## 2020-01-03 NOTE — Discharge Instructions (Signed)

## 2020-01-04 ENCOUNTER — Ambulatory Visit (HOSPITAL_COMMUNITY): Admission: RE | Admit: 2020-01-04 | Payer: BC Managed Care – PPO | Source: Ambulatory Visit

## 2020-01-05 ENCOUNTER — Telehealth: Payer: Self-pay | Admitting: Physician Assistant

## 2020-01-05 NOTE — Telephone Encounter (Signed)
See TE, 01/01/20.

## 2020-01-05 NOTE — Telephone Encounter (Signed)
Glad to hear. plz call one last time Monday for update.

## 2020-01-05 NOTE — Telephone Encounter (Signed)
Noted  

## 2020-01-05 NOTE — Telephone Encounter (Signed)
Pt returned your call.  

## 2020-01-05 NOTE — Telephone Encounter (Signed)
Pt returning call.  I asked how she's doing.  States she is doing much better.  No more HA and cough is mild but improving.

## 2020-01-05 NOTE — Telephone Encounter (Signed)
Lvm asking pt to call back.  Need update on sxs.  °

## 2020-01-10 NOTE — Telephone Encounter (Signed)
Lvm asking pt to call back.  Need update on sxs.  °

## 2020-12-23 ENCOUNTER — Other Ambulatory Visit (HOSPITAL_COMMUNITY)
Admission: AD | Admit: 2020-12-23 | Discharge: 2020-12-23 | Disposition: A | Discharge status: Home / Self Care | Payer: BC Managed Care – PPO | Attending: Obstetrics & Gynecology | Admitting: Obstetrics & Gynecology

## 2020-12-23 DIAGNOSIS — O26859 Spotting complicating pregnancy, unspecified trimester: Secondary | ICD-10-CM | POA: Diagnosis present

## 2020-12-23 DIAGNOSIS — R102 Pelvic and perineal pain: Secondary | ICD-10-CM | POA: Diagnosis not present

## 2020-12-23 LAB — HCG, QUANTITATIVE, PREGNANCY: hCG, Beta Chain, Quant, S: 45 m[IU]/mL — ABNORMAL HIGH (ref <5)

## 2022-01-07 ENCOUNTER — Other Ambulatory Visit: Payer: Self-pay

## 2022-01-08 ENCOUNTER — Other Ambulatory Visit: Payer: Self-pay | Admitting: Obstetrics & Gynecology

## 2022-01-08 DIAGNOSIS — Z363 Encounter for antenatal screening for malformations: Secondary | ICD-10-CM

## 2022-01-09 ENCOUNTER — Ambulatory Visit: Payer: BC Managed Care – PPO | Attending: Obstetrics & Gynecology

## 2022-01-09 ENCOUNTER — Ambulatory Visit: Payer: BC Managed Care – PPO | Admitting: *Deleted

## 2022-01-09 VITALS — BP 136/86 | HR 102

## 2022-01-09 DIAGNOSIS — Z363 Encounter for antenatal screening for malformations: Secondary | ICD-10-CM

## 2022-01-09 DIAGNOSIS — O09522 Supervision of elderly multigravida, second trimester: Secondary | ICD-10-CM | POA: Diagnosis present

## 2022-01-10 ENCOUNTER — Other Ambulatory Visit: Payer: Self-pay | Admitting: *Deleted

## 2022-01-10 DIAGNOSIS — O09522 Supervision of elderly multigravida, second trimester: Secondary | ICD-10-CM

## 2022-01-10 DIAGNOSIS — R638 Other symptoms and signs concerning food and fluid intake: Secondary | ICD-10-CM

## 2022-01-10 DIAGNOSIS — O99212 Obesity complicating pregnancy, second trimester: Secondary | ICD-10-CM

## 2022-02-06 ENCOUNTER — Ambulatory Visit: Payer: BC Managed Care – PPO

## 2022-02-13 ENCOUNTER — Encounter (HOSPITAL_COMMUNITY): Payer: Self-pay | Admitting: Obstetrics & Gynecology

## 2022-02-13 ENCOUNTER — Inpatient Hospital Stay (HOSPITAL_COMMUNITY)
Admission: AD | Admit: 2022-02-13 | Discharge: 2022-02-13 | Disposition: A | Discharge status: Home / Self Care | Payer: BC Managed Care – PPO | Attending: Obstetrics & Gynecology | Admitting: Obstetrics & Gynecology

## 2022-02-13 DIAGNOSIS — O09293 Supervision of pregnancy with other poor reproductive or obstetric history, third trimester: Secondary | ICD-10-CM | POA: Insufficient documentation

## 2022-02-13 DIAGNOSIS — R21 Rash and other nonspecific skin eruption: Secondary | ICD-10-CM | POA: Diagnosis not present

## 2022-02-13 DIAGNOSIS — O09523 Supervision of elderly multigravida, third trimester: Secondary | ICD-10-CM | POA: Insufficient documentation

## 2022-02-13 DIAGNOSIS — Z3A30 30 weeks gestation of pregnancy: Secondary | ICD-10-CM | POA: Diagnosis not present

## 2022-02-13 DIAGNOSIS — O10919 Unspecified pre-existing hypertension complicating pregnancy, unspecified trimester: Secondary | ICD-10-CM | POA: Diagnosis present

## 2022-02-13 DIAGNOSIS — O10913 Unspecified pre-existing hypertension complicating pregnancy, third trimester: Secondary | ICD-10-CM | POA: Insufficient documentation

## 2022-02-13 LAB — TYPE AND SCREEN
ABO/RH(D): A POS
Antibody Screen: NEGATIVE

## 2022-02-13 LAB — URINALYSIS, ROUTINE W REFLEX MICROSCOPIC
Bilirubin Urine: NEGATIVE
Glucose, UA: NEGATIVE mg/dL
Hgb urine dipstick: NEGATIVE
Ketones, ur: NEGATIVE mg/dL
Leukocytes,Ua: NEGATIVE
Nitrite: NEGATIVE
Protein, ur: 30 mg/dL — AB
Specific Gravity, Urine: 1.02 (ref 1.005–1.030)
pH: 5 (ref 5.0–8.0)

## 2022-02-13 LAB — CBC WITH DIFFERENTIAL/PLATELET
Abs Immature Granulocytes: 0.04 10*3/uL (ref 0.00–0.07)
Basophils Absolute: 0 10*3/uL (ref 0.0–0.1)
Basophils Relative: 0 %
Eosinophils Absolute: 0.1 10*3/uL (ref 0.0–0.5)
Eosinophils Relative: 1 %
HCT: 35 % — ABNORMAL LOW (ref 36.0–46.0)
Hemoglobin: 11.8 g/dL — ABNORMAL LOW (ref 12.0–15.0)
Immature Granulocytes: 0 %
Lymphocytes Relative: 21 %
Lymphs Abs: 2.1 10*3/uL (ref 0.7–4.0)
MCH: 31.6 pg (ref 26.0–34.0)
MCHC: 33.7 g/dL (ref 30.0–36.0)
MCV: 93.8 fL (ref 80.0–100.0)
Monocytes Absolute: 0.6 10*3/uL (ref 0.1–1.0)
Monocytes Relative: 5 %
Neutro Abs: 7.6 10*3/uL (ref 1.7–7.7)
Neutrophils Relative %: 73 %
Platelets: 272 10*3/uL (ref 150–400)
RBC: 3.73 MIL/uL — ABNORMAL LOW (ref 3.87–5.11)
RDW: 12.9 % (ref 11.5–15.5)
WBC: 10.4 10*3/uL (ref 4.0–10.5)
nRBC: 0 % (ref 0.0–0.2)

## 2022-02-13 LAB — PROTEIN / CREATININE RATIO, URINE
Creatinine, Urine: 139 mg/dL
Protein Creatinine Ratio: 0.2 mg/mg{Cre} — ABNORMAL HIGH (ref 0.00–0.15)
Total Protein, Urine: 28 mg/dL

## 2022-02-13 LAB — COMPREHENSIVE METABOLIC PANEL
ALT: 15 U/L (ref 0–44)
AST: 19 U/L (ref 15–41)
Albumin: 2.7 g/dL — ABNORMAL LOW (ref 3.5–5.0)
Alkaline Phosphatase: 107 U/L (ref 38–126)
Anion gap: 11 (ref 5–15)
BUN: 7 mg/dL (ref 6–20)
CO2: 20 mmol/L — ABNORMAL LOW (ref 22–32)
Calcium: 9 mg/dL (ref 8.9–10.3)
Chloride: 107 mmol/L (ref 98–111)
Creatinine, Ser: 0.77 mg/dL (ref 0.44–1.00)
GFR, Estimated: >60 mL/min (ref >60)
Glucose, Bld: 91 mg/dL (ref 70–99)
Potassium: 3.8 mmol/L (ref 3.5–5.1)
Sodium: 138 mmol/L (ref 135–145)
Total Bilirubin: 0.3 mg/dL (ref 0.3–1.2)
Total Protein: 6.7 g/dL (ref 6.5–8.1)

## 2022-02-13 MED ORDER — LABETALOL HCL 5 MG/ML IV SOLN
20.0000 mg | INTRAVENOUS | Status: DC | PRN
  Administered 2022-02-13: 20 mg via INTRAVENOUS
  Filled 2022-02-13: qty 4

## 2022-02-13 MED ORDER — ZOLPIDEM TARTRATE 5 MG PO TABS: 5.0000 mg | ORAL_TABLET | Freq: Every evening | ORAL | Status: DC | PRN

## 2022-02-13 MED ORDER — HYDRALAZINE HCL 20 MG/ML IJ SOLN
5.0000 mg | INTRAMUSCULAR | Status: DC | PRN
  Administered 2022-02-13: 5 mg via INTRAVENOUS
  Filled 2022-02-13: qty 1

## 2022-02-13 MED ORDER — ACETAMINOPHEN 325 MG PO TABS: 650.0000 mg | ORAL_TABLET | ORAL | Status: DC | PRN

## 2022-02-13 MED ORDER — LABETALOL HCL 5 MG/ML IV SOLN: 80.0000 mg | INTRAVENOUS | Status: DC | PRN

## 2022-02-13 MED ORDER — PRENATAL MULTIVITAMIN CH: 1.0000 | ORAL_TABLET | Freq: Every day | ORAL | Status: DC

## 2022-02-13 MED ORDER — LABETALOL HCL 200 MG PO TABS: 200.0000 mg | ORAL_TABLET | Freq: Three times a day (TID) | ORAL | 1 refills | Status: DC

## 2022-02-13 MED ORDER — CALCIUM CARBONATE ANTACID 500 MG PO CHEW: 2.0000 | CHEWABLE_TABLET | ORAL | Status: DC | PRN

## 2022-02-13 MED ORDER — LABETALOL HCL 5 MG/ML IV SOLN: 40.0000 mg | INTRAVENOUS | Status: DC | PRN

## 2022-02-13 MED ORDER — LABETALOL HCL 100 MG PO TABS
200.0000 mg | ORAL_TABLET | Freq: Three times a day (TID) | ORAL | Status: DC
  Administered 2022-02-13: 200 mg via ORAL
  Filled 2022-02-13: qty 2

## 2022-02-13 MED ORDER — BETAMETHASONE SOD PHOS & ACET 6 (3-3) MG/ML IJ SUSP
12.0000 mg | INTRAMUSCULAR | Status: DC
  Administered 2022-02-13: 12 mg via INTRAMUSCULAR
  Filled 2022-02-13: qty 5

## 2022-02-13 MED ORDER — DOCUSATE SODIUM 100 MG PO CAPS: 100.0000 mg | ORAL_CAPSULE | Freq: Every day | ORAL | Status: DC

## 2022-02-13 MED ORDER — LABETALOL HCL 5 MG/ML IV SOLN: 20.0000 mg | INTRAVENOUS | Status: DC | PRN

## 2022-02-13 MED ORDER — LACTATED RINGERS IV SOLN: 125.0000 mL/h | INTRAVENOUS | Status: DC

## 2022-02-13 MED ORDER — HYDRALAZINE HCL 20 MG/ML IJ SOLN: 10.0000 mg | INTRAMUSCULAR | Status: DC | PRN

## 2022-02-13 NOTE — MAU Note (Signed)
Amber Nguyen is a 36 y.o. at [redacted]w[redacted]d here in MAU reporting: pt was sent over from the office due to high blood pressure at her visit. Pt states she had blurry vision today at her appt but denies blurry vision right now. Pt states she has a small HA but rates it 1/10. Pt denies RUQ pain. Pt also reports soreness in her vagina that began 2 days ago but went away yesterday and started again today. Pt denies VB or LOF. +FM   Onset of complaint: 02/13/2022 Pain score: 1/10 head 1/10 vagina  Vitals:   02/13/22 1859  Pulse: 87  Resp: 18  Temp: 98.2 F (36.8 C)  SpO2: 98%     FHT:142 Lab orders placed from triage: UA

## 2022-02-13 NOTE — MAU Provider Note (Signed)
History     CSN: [id number]  Arrival date and time: 02/13/22 1809   Event Date/Time   First Provider Initiated Contact with Patient 02/13/22 1909      Chief Complaint  Patient presents with   Hypertension   Amber Nguyen , a  36 y.o. [phone] at 4458w5d presents to MAU after being sent over from the office for BPs of 141/110. Patient states her BPs have slowly been increasing over the last month, but today had been the highest. Patient denies a headache and visual changes at this time. But noted she saw "sparkly spots" today in the office, when laying back for measurements. She denies Epigastric pain, vaginal bleeding. Leaking of fluid, overt swelling and contractions. She endorses positive fetal movement.      OB History     Gravida  4   Para  1   Term  1   Preterm  0   AB  2   Living  1      SAB  2   IAB  0   Ectopic  0   Multiple  0   Live Births  1           Past Medical History:  Diagnosis Date   Abnormal pap 01/09   +HPV   Anxiety    situational   Cholecystitis    COVID-19 virus infection 01/01/2020   Heartburn during pregnancy    Pre-eclampsia     Past Surgical History:  Procedure Laterality Date   CESAREAN SECTION N/A 10/15/2013   Procedure: PRIMARY CESAREAN SECTION;  Surgeon: Purcell NailsAngela Y Roberts, MD;  Location: [facility];  Service: Obstetrics;  Laterality: N/A;   FRACTURE SURGERY     left wrist   TENDON REPAIR     right hand   tubes in ears      Family History  Problem Relation Age of Onset   Thyroid disease Mother    Hyperlipidemia Mother    Hypertension Mother    Hypertension Father    Hyperlipidemia Father    Diabetes Paternal Grandmother    Diabetes Paternal Grandfather    Cancer Maternal Grandmother        lung   Hyperlipidemia Maternal Uncle    Hypertension Maternal Uncle    Hyperlipidemia Paternal Aunt    Hypertension Paternal Aunt    Hyperlipidemia Paternal Uncle    Hypertension Paternal Uncle    Heart attack  Paternal Uncle    Healthy Son        2    Social History   Tobacco Use   Smoking status: Never   Smokeless tobacco: Never  Vaping Use   Vaping Use: Never used  Substance Use Topics   Alcohol use: No   Drug use: No    Allergies: No Known Allergies  Medications Prior to Admission  Medication Sig Dispense Refill Last Dose   aspirin EC 81 MG tablet Take 81 mg by mouth daily. Swallow whole.   02/13/2022 at 0645   famotidine (PEPCID) 10 MG tablet Take 10 mg by mouth 2 (two) times daily.   02/12/2022 at 0645   Pediatric Multivit-Minerals (FLINTSTONES COMPLETE) CHEW Chew by mouth.   02/13/2022 at 0645    Review of Systems  Constitutional:  Negative for chills, fatigue and fever.  Eyes:  Negative for pain and visual disturbance.  Respiratory:  Negative for apnea, shortness of breath and wheezing.   Cardiovascular:  Negative for chest pain and palpitations.  Gastrointestinal:  Negative for abdominal  pain, constipation, diarrhea, nausea and vomiting.  Genitourinary:  Negative for difficulty urinating, dysuria, pelvic pain, vaginal bleeding, vaginal discharge and vaginal pain.  Musculoskeletal:  Negative for back pain.  Neurological:  Negative for dizziness, seizures, syncope, weakness, light-headedness and headaches.  Psychiatric/Behavioral:  Negative for suicidal ideas.    Physical Exam   Blood pressure (!) 162/94, pulse 92, temperature 98.2 F (36.8 C), temperature source Oral, resp. rate 18, height 5\' 3"  (1.6 m), weight 101.2 kg, last menstrual period 07/13/2021, SpO2 98 %.  Physical Exam Vitals and nursing note reviewed.  Constitutional:      General: She is not in acute distress.    Appearance: Normal appearance.  HENT:     Head: Normocephalic.  Cardiovascular:     Rate and Rhythm: Normal rate.  Pulmonary:     Effort: Pulmonary effort is normal.  Musculoskeletal:        General: Swelling present.     Cervical back: Normal range of motion.     Comments: Bilateral +1  non-pitting edema noted.   Skin:    General: Skin is warm and dry.     Capillary Refill: Capillary refill takes less than 2 seconds.  Neurological:     Mental Status: She is alert and oriented to person, place, and time.     Gait: Gait normal.     Deep Tendon Reflexes: Reflexes normal.  Psychiatric:        Mood and Affect: Mood normal.    FHT: 145bpm with moderate variability. No decels noted (appropriate for gestational age)  - Toco: irregular with UI. (Patient unaware of contractions.)   MAU Course  Procedures Orders Placed This Encounter  Procedures   CBC with Differential/Platelet   Comprehensive metabolic panel   Protein / creatinine ratio, urine   Urinalysis, Routine w reflex microscopic Urine, Clean Catch   Comprehensive metabolic panel   CBC   Diet regular Room service appropriate? Yes; Fluid consistency: Thin   Notify physician (specify) Confirmatory reading of BP> 160/110 15 minutes later   Apply Hypertensive Disorders of Pregnancy Care Plan   Notify physician (specify)   Vital signs   Defer vaginal exam for vaginal bleeding or PROM <37 weeks   Apply Antepartum Care Plan   Initiate Oral Care Protocol   Initiate Carrier Fluid Protocol   SCDs   Fetal monitoring   Continuous tocometry   Bed rest with bathroom privileges   Notify physician (specify) Confirmatory reading of BP> 160/110 15 minutes later   Apply Hypertensive Disorders of Pregnancy Care Plan   Measure blood pressure   Full code   Type and screen [facility]   Place in observation (patient's expected length of stay will be less than 2 midnights)   Results for orders placed or performed during the hospital encounter of 02/13/22 (from the past 24 hour(s))  CBC with Differential/Platelet     Status: Abnormal   Collection Time: 02/13/22  6:50 PM  Result Value Ref Range   WBC 10.4 4.0 - 10.5 K/uL   RBC 3.73 (L) 3.87 - 5.11 MIL/uL   Hemoglobin 11.8 (L) 12.0 - 15.0 g/dL   HCT 02/15/22 (L)  02.7 - 46.0 %   MCV 93.8 80.0 - 100.0 fL   MCH 31.6 26.0 - 34.0 pg   MCHC 33.7 30.0 - 36.0 g/dL   RDW 25.3 66.4 - 40.3 %   Platelets 272 150 - 400 K/uL   nRBC 0.0 0.0 - 0.2 %   Neutrophils Relative %  73 %   Neutro Abs 7.6 1.7 - 7.7 K/uL   Lymphocytes Relative 21 %   Lymphs Abs 2.1 0.7 - 4.0 K/uL   Monocytes Relative 5 %   Monocytes Absolute 0.6 0.1 - 1.0 K/uL   Eosinophils Relative 1 %   Eosinophils Absolute 0.1 0.0 - 0.5 K/uL   Basophils Relative 0 %   Basophils Absolute 0.0 0.0 - 0.1 K/uL   Immature Granulocytes 0 %   Abs Immature Granulocytes 0.04 0.00 - 0.07 K/uL  Comprehensive metabolic panel     Status: Abnormal   Collection Time: 02/13/22  6:50 PM  Result Value Ref Range   Sodium 138 135 - 145 mmol/L   Potassium 3.8 3.5 - 5.1 mmol/L   Chloride 107 98 - 111 mmol/L   CO2 20 (L) 22 - 32 mmol/L   Glucose, Bld 91 70 - 99 mg/dL   BUN 7 6 - 20 mg/dL   Creatinine, Ser 5.36 0.44 - 1.00 mg/dL   Calcium 9.0 8.9 - 64.4 mg/dL   Total Protein 6.7 6.5 - 8.1 g/dL   Albumin 2.7 (L) 3.5 - 5.0 g/dL   AST 19 15 - 41 U/L   ALT 15 0 - 44 U/L   Alkaline Phosphatase 107 38 - 126 U/L   Total Bilirubin 0.3 0.3 - 1.2 mg/dL   GFR, Estimated >03 >47 mL/min   Anion gap 11 5 - 15  Protein / creatinine ratio, urine     Status: Abnormal   Collection Time: 02/13/22  7:06 PM  Result Value Ref Range   Creatinine, Urine 139 mg/dL   Total Protein, Urine 28 mg/dL   Protein Creatinine Ratio 0.20 (H) 0.00 - 0.15 mg/mg[Cre]  Urinalysis, Routine w reflex microscopic Urine, Clean Catch     Status: Abnormal   Collection Time: 02/13/22  7:06 PM  Result Value Ref Range   Color, Urine YELLOW YELLOW   APPearance HAZY (A) CLEAR   Specific Gravity, Urine 1.020 1.005 - 1.030   pH 5.0 5.0 - 8.0   Glucose, UA NEGATIVE NEGATIVE mg/dL   Hgb urine dipstick NEGATIVE NEGATIVE   Bilirubin Urine NEGATIVE NEGATIVE   Ketones, ur NEGATIVE NEGATIVE mg/dL   Protein, ur 30 (A) NEGATIVE mg/dL   Nitrite NEGATIVE NEGATIVE    Leukocytes,Ua NEGATIVE NEGATIVE   RBC / HPF 0-5 0 - 5 RBC/hpf   WBC, UA 0-5 0 - 5 WBC/hpf   Bacteria, UA RARE (A) NONE SEEN   Squamous Epithelial / LPF 0-5 0 - 5   Mucus PRESENT     Patient Vitals for the past 24 hrs:  BP Temp Temp src Pulse Resp SpO2 Height Weight  02/13/22 2015 (!) 162/94 -- -- 92 -- 98 % -- --  02/13/22 1950 (!) 161/94 -- -- 81 -- 99 % -- --  02/13/22 1930 (!) 163/98 -- -- 81 -- 99 % -- --  02/13/22 1916 (!) 167/91 -- -- 81 -- 99 % -- --  02/13/22 1901 -- -- -- 83 -- -- -- --  02/13/22 1859 (!) 158/95 98.2 F (36.8 C) Oral 87 18 98 % 5\' 3"  (1.6 m) 101.2 kg    Lab results reviewed and interpreted by Me.   MDM - PreE Labs normal - Patient not having Severe Features at this time.  Call placed to MD on call. Reviewed patient presentation and current clinical picture. Reviewed Intervention. Per MD admit to [facility] with BMZ protocol for overnight obs.    Assessment and  Plan  - Report given to RN.  - Admit to [facility].   Claudette Head, MSN, CNM  02/13/2022, 8:31 PM

## 2022-02-13 NOTE — H&P (Signed)
Amber Nguyen is a 36 y.o. female [phone] at [redacted]w[redacted]d presenting for BP evaluation; sent from OV.  In office, BPs were 152/102, 148/92, 148/102.  On review of prenatal records, patient has had mild range BPs sporadically this pregnancy since <20 weeks.  She has h/o GHTN and has taken ASA.  Patient denies HA, vision change, RUQ pain, CP/SOB.  Patient "saw stars" in the office today when she went from supine to seated position.  MAU labs wnl and P:C 0.20.  Antepartum course complicated by previous C/S and plans for repeat.  Patient has anxiety d/o and is not taking any medications.  Has FHx Polycystic kidney dz; declined genetic testing.  AMA with low risk NIPS.  First pregnancy was delivered at 37 weeks for cholestasis.    OB History     Gravida  4   Para  1   Term  1   Preterm  0   AB  2   Living  1      SAB  2   IAB  0   Ectopic  0   Multiple  0   Live Births  1          Past Medical History:  Diagnosis Date   Abnormal pap 01/09   +HPV   Anxiety    situational   Cholecystitis    COVID-19 virus infection 01/01/2020   Heartburn during pregnancy    Pre-eclampsia    Past Surgical History:  Procedure Laterality Date   CESAREAN SECTION N/A 10/15/2013   Procedure: PRIMARY CESAREAN SECTION;  Surgeon: Purcell Nails, MD;  Location: [facility];  Service: Obstetrics;  Laterality: N/A;   FRACTURE SURGERY     left wrist   TENDON REPAIR     right hand   tubes in ears     Family History: family history includes Cancer in her maternal grandmother; Diabetes in her paternal grandfather and paternal grandmother; Healthy in her son; Heart attack in her paternal uncle; Hyperlipidemia in her father, maternal uncle, mother, paternal aunt, and paternal uncle; Hypertension in her father, maternal uncle, mother, paternal aunt, and paternal uncle; Thyroid disease in her mother. Social History:  reports that she has never smoked. She has never used smokeless tobacco. She reports that she does  not drink alcohol and does not use drugs.     Maternal Diabetes: No Genetic Screening: Normal Maternal Ultrasounds/Referrals: Normal Fetal Ultrasounds or other Referrals:  None Maternal Substance Abuse:  No Significant Maternal Medications:  None Significant Maternal Lab Results:  None Number of Prenatal Visits:greater than 3 verified prenatal visits Other Comments:  None  Review of Systems Maternal Medical History:  Fetal activity: Perceived fetal activity is normal.   Last perceived fetal movement was within the past hour.   Prenatal complications: PIH.   Prenatal Complications - Diabetes: none.     Blood pressure (!) 153/83, pulse 78, temperature 98.2 F (36.8 C), temperature source Oral, resp. rate 18, height 5\' 3"  (1.6 m), weight 101.2 kg, last menstrual period 07/13/2021, SpO2 99 %. Maternal Exam:  Abdomen: Patient reports no abdominal tenderness. Fundal height is c/w dates.     Fetal Exam Fetal Monitor Review: Baseline rate: 140.  Variability: moderate (6-25 bpm).   Pattern: accelerations present and no decelerations.   Fetal State Assessment: Category I - tracings are normal.   Physical Exam Constitutional:      Appearance: Normal appearance.  HENT:     Head: Normocephalic and atraumatic.  Pulmonary:  Effort: Pulmonary effort is normal.  Abdominal:     Palpations: Abdomen is soft.  Musculoskeletal:        General: Normal range of motion.     Cervical back: Normal range of motion.  Skin:    General: Skin is warm and dry.  Neurological:     Mental Status: She is alert and oriented to person, place, and time.  Psychiatric:        Mood and Affect: Mood normal.        Behavior: Behavior normal.     Prenatal labs: ABO, Rh: --/--/PENDING (12/13 1948) Antibody: PENDING (12/13 1948) Rubella:   RPR:    HBsAg:    HIV:    GBS:     Assessment/Plan: 36yo [id number] at [redacted]w[redacted]d with chronic hypertension with exacerbation I recommend admission for BMZ and  anti-hypertension management. Patient expresses strong desire for d/c home as she has 72 yo autistic son.   Given asymptomatic and excellent response to IV labetalol 20, I agree to outpatient management Will d/c home with labetalol 200 TID and BMZ #1 before d/c.  Patient to f/u in office tomorrow for BP check and BMZ #2. Patient has BP cuff at home and will monitor her BPs.  Pre-E precautions reviewed.  She refuses to take Tylenol as she has read studies that there is an association with autism.     Mitchel Honour 02/13/2022, 9:22 PM

## 2022-02-13 NOTE — MAU Note (Signed)
Betamethasone discarded by RN in MAU. Pt will receive her second dose in the office tomorrow as discussed with Dr. Langston Masker.

## 2022-02-13 NOTE — Progress Notes (Signed)
CNM provider at bedside. Patient talking during BP.   Markeia Harkless Danella Deis) Suzie Portela, MSN, CNM  [facility]  02/13/22 7:17 PM

## 2022-03-04 NOTE — L&D Delivery Note (Signed)
Operative Note  PROCEDURE DATE: 03/18/22   PREOPERATIVE DIAGNOSIS: Preeclampsia with severe features, [redacted]w[redacted]d, history of prior C section   POSTOPERATIVE DIAGNOSIS: The same   PROCEDURE:    Repeat Low Transverse Cesarean Section   SURGEON:  Dr. Austin Vanden Fawaz ASSISTANT: Dr. Simone Autry-Lott  An experienced assistant was required given the standard of surgical care given the complexity of the case.  This assistant was needed for exposure, dissection, suctioning, retraction, instrument exchange, assisting with delivery with administration of fundal pressure, and for overall help during the procedure.    INDICATIONS: This is a 36 y.o. yo [id number] at [redacted]w[redacted]d requiring cesarean section secondary to preeclampsia with severe features and history of prior C section. Please see H&P for details.    Decision made to proceed with LTCS. The risks of cesarean section discussed with the patient included but were not limited to: bleeding which may require transfusion or reoperation; infection which may require antibiotics; injury to bowel, bladder, ureters or other surrounding organs; injury to the fetus; need for additional procedures including hysterectomy in the event of a life-threatening hemorrhage; placental abnormalities wth subsequent pregnancies, incisional problems, thromboembolic phenomenon and other postoperative/anesthesia complications. The patient agreed with the proposed plan, giving informed consent for the procedure.     FINDINGS:  Viable female infant in vertex presentation, APGARs 9, 9,  Weight pending, Amniotic fluid clear,  Intact placenta, three vessel cord.  Grossly normal uterus. .   ANESTHESIA:    Epidural ESTIMATED BLOOD LOSS: 276 cc SPECIMENS: Placenta for path (severe preeclampsia) COMPLICATIONS: None immediate    PROCEDURE IN DETAIL:  The patient received intravenous antibiotics (2g Ancef) and had sequential compression devices applied to her lower extremities while in the  preoperative area.  She was then taken to the operating room where epidural anesthesia was dosed up to surgical level and was found to be adequate. She was then placed in a dorsal supine position with a leftward tilt, and prepped and draped in a sterile manner.  A foley catheter was placed into her bladder and attached to constant gravity.  After an adequate timeout was performed, a Pfannenstiel skin incision was made with scalpel and carried through to the underlying layer of fascia. The fascia was incised in the midline and this incision was extended bilaterally using the Mayo scissors. Kocher clamps were applied to the superior aspect of the fascial incision and the underlying rectus muscles were dissected off bluntly. A similar process was carried out on the inferior aspect of the facial incision. The rectus muscles were separated in the midline bluntly and the peritoneum was entered bluntly.  A bladder flap was created sharply and developed bluntly. A transverse hysterotomy was made with a scalpel and extended bilaterally bluntly. The bladder blade was then removed. The infant was successfully delivered, and cord was clamped and cut and infant was handed over to awaiting neonatology team. Uterine massage was then administered and the placenta delivered intact with three-vessel cord. Cord gas for arterial collected. The uterus was cleared of clot and debris.  The hysterotomy was closed with 0 vicryl.  A second imbricating suture of 0-vicryl was used to reinforce the incision and aid in hemostasis.The fascia was closed with 0-PDS in a running fashion from each angle and meeting in the middle with good restoration of anatomy.  The subcutaneus tissue was irrigated and was reapproximated using running plain gut.  The skin was closed with 4-0 Vicryl in a subcuticular fashion.  All surgical site and was hemostatic   at end of procedure without any further bleeding on exam.    Pt tolerated the procedure well. All  sponge/lap/needle counts were correct  X 2. Pt taken to recovery room in stable condition.     Alpha Gula MD

## 2022-03-18 ENCOUNTER — Other Ambulatory Visit: Payer: Self-pay

## 2022-03-18 ENCOUNTER — Observation Stay (HOSPITAL_COMMUNITY): Payer: BC Managed Care – PPO | Admitting: Anesthesiology

## 2022-03-18 ENCOUNTER — Inpatient Hospital Stay (HOSPITAL_COMMUNITY)
Admission: AD | Admit: 2022-03-18 | Discharge: 2022-03-21 | DRG: 788 | Disposition: A | Discharge status: Home / Self Care | Payer: BC Managed Care – PPO | Attending: Obstetrics and Gynecology | Admitting: Obstetrics and Gynecology

## 2022-03-18 ENCOUNTER — Encounter (HOSPITAL_COMMUNITY): Payer: Self-pay | Admitting: Obstetrics and Gynecology

## 2022-03-18 ENCOUNTER — Encounter (HOSPITAL_COMMUNITY)
Admission: AD | Disposition: A | Discharge status: Home / Self Care | Payer: Self-pay | Source: Home / Self Care | Attending: Obstetrics and Gynecology

## 2022-03-18 DIAGNOSIS — O169 Unspecified maternal hypertension, unspecified trimester: Secondary | ICD-10-CM | POA: Diagnosis present

## 2022-03-18 DIAGNOSIS — O34211 Maternal care for low transverse scar from previous cesarean delivery: Secondary | ICD-10-CM | POA: Diagnosis present

## 2022-03-18 DIAGNOSIS — Z3A35 35 weeks gestation of pregnancy: Secondary | ICD-10-CM | POA: Diagnosis not present

## 2022-03-18 DIAGNOSIS — R030 Elevated blood-pressure reading, without diagnosis of hypertension: Secondary | ICD-10-CM | POA: Diagnosis present

## 2022-03-18 DIAGNOSIS — O114 Pre-existing hypertension with pre-eclampsia, complicating childbirth: Secondary | ICD-10-CM | POA: Diagnosis present

## 2022-03-18 DIAGNOSIS — Z8616 Personal history of COVID-19: Secondary | ICD-10-CM | POA: Diagnosis not present

## 2022-03-18 DIAGNOSIS — O1002 Pre-existing essential hypertension complicating childbirth: Secondary | ICD-10-CM | POA: Diagnosis present

## 2022-03-18 DIAGNOSIS — O99214 Obesity complicating childbirth: Secondary | ICD-10-CM | POA: Diagnosis present

## 2022-03-18 DIAGNOSIS — O1414 Severe pre-eclampsia complicating childbirth: Secondary | ICD-10-CM | POA: Diagnosis not present

## 2022-03-18 DIAGNOSIS — O1490 Unspecified pre-eclampsia, unspecified trimester: Secondary | ICD-10-CM | POA: Diagnosis present

## 2022-03-18 LAB — CBC
HCT: 34.7 % — ABNORMAL LOW (ref 36.0–46.0)
Hemoglobin: 11.6 g/dL — ABNORMAL LOW (ref 12.0–15.0)
MCH: 31.6 pg (ref 26.0–34.0)
MCHC: 33.4 g/dL (ref 30.0–36.0)
MCV: 94.6 fL (ref 80.0–100.0)
Platelets: 236 10*3/uL (ref 150–400)
RBC: 3.67 MIL/uL — ABNORMAL LOW (ref 3.87–5.11)
RDW: 13.7 % (ref 11.5–15.5)
WBC: 11 10*3/uL — ABNORMAL HIGH (ref 4.0–10.5)
nRBC: 0 % (ref 0.0–0.2)

## 2022-03-18 LAB — PROTEIN / CREATININE RATIO, URINE
Creatinine, Urine: 84 mg/dL
Protein Creatinine Ratio: 0.19 mg/mg{Cre} — ABNORMAL HIGH (ref 0.00–0.15)
Total Protein, Urine: 16 mg/dL

## 2022-03-18 LAB — COMPREHENSIVE METABOLIC PANEL
ALT: 11 U/L (ref 0–44)
AST: 18 U/L (ref 15–41)
Albumin: 2.7 g/dL — ABNORMAL LOW (ref 3.5–5.0)
Alkaline Phosphatase: 120 U/L (ref 38–126)
Anion gap: 12 (ref 5–15)
BUN: 8 mg/dL (ref 6–20)
CO2: 17 mmol/L — ABNORMAL LOW (ref 22–32)
Calcium: 9.4 mg/dL (ref 8.9–10.3)
Chloride: 105 mmol/L (ref 98–111)
Creatinine, Ser: 0.76 mg/dL (ref 0.44–1.00)
GFR, Estimated: >60 mL/min (ref >60)
Glucose, Bld: 79 mg/dL (ref 70–99)
Potassium: 3.8 mmol/L (ref 3.5–5.1)
Sodium: 134 mmol/L — ABNORMAL LOW (ref 135–145)
Total Bilirubin: 0.3 mg/dL (ref 0.3–1.2)
Total Protein: 6.6 g/dL (ref 6.5–8.1)

## 2022-03-18 LAB — TYPE AND SCREEN
ABO/RH(D): A POS
Antibody Screen: NEGATIVE

## 2022-03-18 SURGERY — Surgical Case
Anesthesia: Spinal

## 2022-03-18 MED ORDER — ACETAMINOPHEN 10 MG/ML IV SOLN
INTRAVENOUS | Status: AC
  Filled 2022-03-18: qty 100

## 2022-03-18 MED ORDER — FENTANYL CITRATE (PF) 100 MCG/2ML IJ SOLN
INTRAMUSCULAR | Status: DC | PRN
  Administered 2022-03-18: 15 ug via INTRATHECAL

## 2022-03-18 MED ORDER — SOD CITRATE-CITRIC ACID 500-334 MG/5ML PO SOLN
30.0000 mL | Freq: Once | ORAL | Status: AC
  Administered 2022-03-18: 30 mL via ORAL
  Filled 2022-03-18: qty 30

## 2022-03-18 MED ORDER — ONDANSETRON HCL 4 MG/2ML IJ SOLN: 4.0000 mg | Freq: Three times a day (TID) | INTRAMUSCULAR | Status: DC | PRN

## 2022-03-18 MED ORDER — MENTHOL 3 MG MT LOZG: 1.0000 | LOZENGE | OROMUCOSAL | Status: DC | PRN

## 2022-03-18 MED ORDER — MAGNESIUM SULFATE 40 GM/1000ML IV SOLN
INTRAVENOUS | Status: AC
  Filled 2022-03-18: qty 1000

## 2022-03-18 MED ORDER — SODIUM CHLORIDE 0.9% FLUSH: 3.0000 mL | INTRAVENOUS | Status: DC | PRN

## 2022-03-18 MED ORDER — BUPIVACAINE IN DEXTROSE 0.75-8.25 % IT SOLN
INTRATHECAL | Status: DC | PRN
  Administered 2022-03-18: 1.6 mL via INTRATHECAL

## 2022-03-18 MED ORDER — SIMETHICONE 80 MG PO CHEW: 80.0000 mg | CHEWABLE_TABLET | ORAL | Status: DC | PRN

## 2022-03-18 MED ORDER — LACTATED RINGERS IV SOLN
125.0000 mL/h | INTRAVENOUS | Status: DC
  Administered 2022-03-18: 125 mL/h via INTRAVENOUS

## 2022-03-18 MED ORDER — SODIUM CHLORIDE 0.9 % IR SOLN
Status: DC | PRN
  Administered 2022-03-18: 1

## 2022-03-18 MED ORDER — ACETAMINOPHEN 10 MG/ML IV SOLN
INTRAVENOUS | Status: DC | PRN
  Administered 2022-03-18: 1000 mg via INTRAVENOUS

## 2022-03-18 MED ORDER — MAGNESIUM SULFATE 40 GM/1000ML IV SOLN
2.0000 g/h | INTRAVENOUS | Status: AC
  Administered 2022-03-19: 2 g/h via INTRAVENOUS
  Filled 2022-03-18: qty 1000

## 2022-03-18 MED ORDER — LABETALOL HCL 5 MG/ML IV SOLN
20.0000 mg | INTRAVENOUS | Status: DC | PRN
  Administered 2022-03-18: 20 mg via INTRAVENOUS
  Filled 2022-03-18: qty 4

## 2022-03-18 MED ORDER — DIPHENHYDRAMINE HCL 50 MG/ML IJ SOLN: 12.5000 mg | INTRAMUSCULAR | Status: DC | PRN

## 2022-03-18 MED ORDER — PHENYLEPHRINE HCL-NACL 20-0.9 MG/250ML-% IV SOLN
INTRAVENOUS | Status: DC | PRN
  Administered 2022-03-18: 60 ug/min via INTRAVENOUS

## 2022-03-18 MED ORDER — HYDRALAZINE HCL 20 MG/ML IJ SOLN: 10.0000 mg | INTRAMUSCULAR | Status: DC | PRN

## 2022-03-18 MED ORDER — LACTATED RINGERS IV SOLN: INTRAVENOUS | Status: DC

## 2022-03-18 MED ORDER — WITCH HAZEL-GLYCERIN EX PADS: 1.0000 | MEDICATED_PAD | CUTANEOUS | Status: DC | PRN

## 2022-03-18 MED ORDER — MAGNESIUM SULFATE BOLUS VIA INFUSION
4.0000 g | Freq: Once | INTRAVENOUS | Status: AC
  Administered 2022-03-18: 4 g via INTRAVENOUS
  Filled 2022-03-18: qty 1000

## 2022-03-18 MED ORDER — KETOROLAC TROMETHAMINE 30 MG/ML IJ SOLN
INTRAMUSCULAR | Status: AC
  Filled 2022-03-18: qty 1

## 2022-03-18 MED ORDER — FENTANYL CITRATE (PF) 100 MCG/2ML IJ SOLN
INTRAMUSCULAR | Status: AC
  Filled 2022-03-18: qty 2

## 2022-03-18 MED ORDER — DOCUSATE SODIUM 100 MG PO CAPS: 100.0000 mg | ORAL_CAPSULE | Freq: Every day | ORAL | Status: DC

## 2022-03-18 MED ORDER — SIMETHICONE 80 MG PO CHEW
80.0000 mg | CHEWABLE_TABLET | Freq: Three times a day (TID) | ORAL | Status: DC
  Administered 2022-03-19 – 2022-03-21 (×7): 80 mg via ORAL
  Filled 2022-03-18 (×7): qty 1

## 2022-03-18 MED ORDER — DIPHENHYDRAMINE HCL 25 MG PO CAPS: 25.0000 mg | ORAL_CAPSULE | ORAL | Status: DC | PRN

## 2022-03-18 MED ORDER — OXYTOCIN-SODIUM CHLORIDE 30-0.9 UT/500ML-% IV SOLN: 2.5000 [IU]/h | INTRAVENOUS | Status: AC

## 2022-03-18 MED ORDER — ACETAMINOPHEN 325 MG PO TABS: 650.0000 mg | ORAL_TABLET | ORAL | Status: DC | PRN

## 2022-03-18 MED ORDER — ONDANSETRON HCL 4 MG/2ML IJ SOLN
INTRAMUSCULAR | Status: AC
  Filled 2022-03-18: qty 2

## 2022-03-18 MED ORDER — MORPHINE SULFATE (PF) 0.5 MG/ML IJ SOLN
INTRAMUSCULAR | Status: AC
  Filled 2022-03-18: qty 10

## 2022-03-18 MED ORDER — CEFAZOLIN SODIUM-DEXTROSE 2-4 GM/100ML-% IV SOLN
2.0000 g | INTRAVENOUS | Status: AC
  Administered 2022-03-18: 2 g via INTRAVENOUS
  Filled 2022-03-18: qty 100

## 2022-03-18 MED ORDER — OXYTOCIN-SODIUM CHLORIDE 30-0.9 UT/500ML-% IV SOLN
INTRAVENOUS | Status: AC
  Filled 2022-03-18: qty 500

## 2022-03-18 MED ORDER — KETOROLAC TROMETHAMINE 30 MG/ML IJ SOLN: 30.0000 mg | Freq: Four times a day (QID) | INTRAMUSCULAR | Status: AC | PRN

## 2022-03-18 MED ORDER — KETOROLAC TROMETHAMINE 30 MG/ML IJ SOLN
30.0000 mg | Freq: Four times a day (QID) | INTRAMUSCULAR | Status: AC | PRN
  Administered 2022-03-18 – 2022-03-19 (×3): 30 mg via INTRAVENOUS
  Filled 2022-03-18 (×2): qty 1

## 2022-03-18 MED ORDER — IBUPROFEN 600 MG PO TABS
600.0000 mg | ORAL_TABLET | Freq: Four times a day (QID) | ORAL | Status: DC
  Administered 2022-03-19 – 2022-03-21 (×8): 600 mg via ORAL
  Filled 2022-03-18 (×9): qty 1

## 2022-03-18 MED ORDER — ZOLPIDEM TARTRATE 5 MG PO TABS: 5.0000 mg | ORAL_TABLET | Freq: Every evening | ORAL | Status: DC | PRN

## 2022-03-18 MED ORDER — LABETALOL HCL 5 MG/ML IV SOLN
80.0000 mg | INTRAVENOUS | Status: DC | PRN
  Administered 2022-03-18: 80 mg via INTRAVENOUS
  Filled 2022-03-18: qty 16

## 2022-03-18 MED ORDER — DIBUCAINE (PERIANAL) 1 % EX OINT: 1.0000 | TOPICAL_OINTMENT | CUTANEOUS | Status: DC | PRN

## 2022-03-18 MED ORDER — LACTATED RINGERS IV SOLN: Freq: Once | INTRAVENOUS | Status: DC

## 2022-03-18 MED ORDER — PRENATAL MULTIVITAMIN CH
1.0000 | ORAL_TABLET | Freq: Every day | ORAL | Status: DC
  Administered 2022-03-19 – 2022-03-20 (×2): 1 via ORAL
  Filled 2022-03-18 (×2): qty 1

## 2022-03-18 MED ORDER — LABETALOL HCL 5 MG/ML IV SOLN: 40.0000 mg | INTRAVENOUS | Status: DC | PRN

## 2022-03-18 MED ORDER — MORPHINE SULFATE (PF) 0.5 MG/ML IJ SOLN
INTRAMUSCULAR | Status: DC | PRN
  Administered 2022-03-18: .15 mg via INTRATHECAL

## 2022-03-18 MED ORDER — LABETALOL HCL 5 MG/ML IV SOLN: 20.0000 mg | INTRAVENOUS | Status: DC | PRN

## 2022-03-18 MED ORDER — NALOXONE HCL 0.4 MG/ML IJ SOLN: 0.4000 mg | INTRAMUSCULAR | Status: DC | PRN

## 2022-03-18 MED ORDER — LABETALOL HCL 5 MG/ML IV SOLN
40.0000 mg | INTRAVENOUS | Status: DC | PRN
  Administered 2022-03-18: 40 mg via INTRAVENOUS
  Filled 2022-03-18: qty 8

## 2022-03-18 MED ORDER — FENTANYL CITRATE (PF) 100 MCG/2ML IJ SOLN: 25.0000 ug | INTRAMUSCULAR | Status: DC | PRN

## 2022-03-18 MED ORDER — SENNOSIDES-DOCUSATE SODIUM 8.6-50 MG PO TABS
2.0000 | ORAL_TABLET | Freq: Every day | ORAL | Status: DC
  Administered 2022-03-19 – 2022-03-21 (×3): 2 via ORAL
  Filled 2022-03-18 (×3): qty 2

## 2022-03-18 MED ORDER — OXYCODONE HCL 5 MG PO TABS
5.0000 mg | ORAL_TABLET | ORAL | Status: DC | PRN
  Administered 2022-03-20 – 2022-03-21 (×2): 5 mg via ORAL
  Filled 2022-03-18 (×2): qty 1

## 2022-03-18 MED ORDER — DEXAMETHASONE SODIUM PHOSPHATE 10 MG/ML IJ SOLN
INTRAMUSCULAR | Status: DC | PRN
  Administered 2022-03-18: 10 mg via INTRAVENOUS

## 2022-03-18 MED ORDER — PHENYLEPHRINE HCL-NACL 20-0.9 MG/250ML-% IV SOLN
INTRAVENOUS | Status: AC
  Filled 2022-03-18: qty 250

## 2022-03-18 MED ORDER — OXYTOCIN-SODIUM CHLORIDE 30-0.9 UT/500ML-% IV SOLN
INTRAVENOUS | Status: DC | PRN
  Administered 2022-03-18: 30 [IU] via INTRAVENOUS

## 2022-03-18 MED ORDER — ACETAMINOPHEN 325 MG PO TABS
650.0000 mg | ORAL_TABLET | ORAL | Status: DC | PRN
  Administered 2022-03-20 – 2022-03-21 (×2): 650 mg via ORAL
  Filled 2022-03-18 (×2): qty 2

## 2022-03-18 MED ORDER — STERILE WATER FOR IRRIGATION IR SOLN
Status: DC | PRN
  Administered 2022-03-18: 1000 mL

## 2022-03-18 MED ORDER — NALOXONE HCL 4 MG/10ML IJ SOLN: 1.0000 ug/kg/h | INTRAVENOUS | Status: DC | PRN

## 2022-03-18 MED ORDER — PRENATAL MULTIVITAMIN CH: 1.0000 | ORAL_TABLET | Freq: Every day | ORAL | Status: DC

## 2022-03-18 MED ORDER — LABETALOL HCL 5 MG/ML IV SOLN: 80.0000 mg | INTRAVENOUS | Status: DC | PRN

## 2022-03-18 MED ORDER — CALCIUM CARBONATE ANTACID 500 MG PO CHEW: 2.0000 | CHEWABLE_TABLET | ORAL | Status: DC | PRN

## 2022-03-18 MED ORDER — ONDANSETRON HCL 4 MG/2ML IJ SOLN
INTRAMUSCULAR | Status: DC | PRN
  Administered 2022-03-18: 4 mg via INTRAVENOUS

## 2022-03-18 MED ORDER — MEPERIDINE HCL 25 MG/ML IJ SOLN: 6.2500 mg | INTRAMUSCULAR | Status: DC | PRN

## 2022-03-18 MED ORDER — DEXAMETHASONE SODIUM PHOSPHATE 10 MG/ML IJ SOLN
INTRAMUSCULAR | Status: AC
  Filled 2022-03-18: qty 1

## 2022-03-18 MED ORDER — COCONUT OIL OIL: 1.0000 | TOPICAL_OIL | Status: DC | PRN

## 2022-03-18 MED ORDER — DIPHENHYDRAMINE HCL 25 MG PO CAPS: 25.0000 mg | ORAL_CAPSULE | Freq: Four times a day (QID) | ORAL | Status: DC | PRN

## 2022-03-18 SURGICAL SUPPLY — 32 items
APL PRP STRL LF DISP 70% ISPRP (MISCELLANEOUS) ×2
APL SKNCLS STERI-STRIP NONHPOA (GAUZE/BANDAGES/DRESSINGS) ×1
BENZOIN TINCTURE PRP APPL 2/3 (GAUZE/BANDAGES/DRESSINGS) IMPLANT
CHLORAPREP W/TINT 26 (MISCELLANEOUS) ×4 IMPLANT
CLAMP UMBILICAL CORD (MISCELLANEOUS) ×2 IMPLANT
CLOTH BEACON ORANGE TIMEOUT ST (SAFETY) ×2 IMPLANT
DRSG OPSITE POSTOP 3X4 (GAUZE/BANDAGES/DRESSINGS) IMPLANT
DRSG OPSITE POSTOP 4X10 (GAUZE/BANDAGES/DRESSINGS) ×2 IMPLANT
ELECT REM PT RETURN 9FT ADLT (ELECTROSURGICAL) ×1
ELECTRODE REM PT RTRN 9FT ADLT (ELECTROSURGICAL) ×2 IMPLANT
EXTRACTOR VACUUM M CUP 4 TUBE (SUCTIONS) IMPLANT
GLOVE BIOGEL PI IND STRL 7.0 (GLOVE) ×4 IMPLANT
GLOVE BIOGEL PI IND STRL 7.5 (GLOVE) ×2 IMPLANT
GLOVE ECLIPSE 7.0 STRL STRAW (GLOVE) ×2 IMPLANT
GOWN STRL REUS W/TWL LRG LVL3 (GOWN DISPOSABLE) ×4 IMPLANT
KIT ABG SYR 3ML LUER SLIP (SYRINGE) IMPLANT
NDL HYPO 25X5/8 SAFETYGLIDE (NEEDLE) IMPLANT
NEEDLE HYPO 25X5/8 SAFETYGLIDE (NEEDLE) IMPLANT
NS IRRIG 1000ML POUR BTL (IV SOLUTION) ×2 IMPLANT
PACK C SECTION WH (CUSTOM PROCEDURE TRAY) ×2 IMPLANT
PAD OB MATERNITY 4.3X12.25 (PERSONAL CARE ITEMS) ×2 IMPLANT
STRIP CLOSURE SKIN 1/2X4 (GAUZE/BANDAGES/DRESSINGS) IMPLANT
SUT PDS AB 0 CTX 60 (SUTURE) ×4 IMPLANT
SUT PLAIN 0 NONE (SUTURE) IMPLANT
SUT PLAIN 2 0 XLH (SUTURE) IMPLANT
SUT VIC AB 0 CT1 36 (SUTURE) IMPLANT
SUT VIC AB 0 CTX 36 (SUTURE) ×3
SUT VIC AB 0 CTX36XBRD ANBCTRL (SUTURE) ×6 IMPLANT
SUT VIC AB 4-0 KS 27 (SUTURE) ×2 IMPLANT
TOWEL OR 17X24 6PK STRL BLUE (TOWEL DISPOSABLE) ×2 IMPLANT
TRAY FOLEY W/BAG SLVR 14FR LF (SET/KITS/TRAYS/PACK) ×2 IMPLANT
WATER STERILE IRR 1000ML POUR (IV SOLUTION) ×2 IMPLANT

## 2022-03-18 NOTE — MAU Provider Note (Signed)
History     CSN: [id number]  Arrival date and time: 03/18/22 1740   Event Date/Time   First Provider Initiated Contact with Patient 03/18/22 1815      Chief Complaint  Patient presents with   Hypertension   Hypertension   Amber Nguyen is a 37 y.o. [id number] at [redacted]w[redacted]d who presents to MAU from the [facility] office for evaluation of acutely elevated blood pressures. Patient's pregnancy is c/b Chronic Hypertension, managed on Labetalol 200 mg TID. She took her midday dose at noon. On arrival to MAU she denies headache, visual disturbances, RUQ/epigastric pain, new onset swelling or weight gain.  Patient is planning repeat cesarean. She has been NPO since 1330.  OB History     Gravida  4   Para  1   Term  1   Preterm  0   AB  2   Living  1      SAB  2   IAB  0   Ectopic  0   Multiple  0   Live Births  1           Past Medical History:  Diagnosis Date   Abnormal pap 01/09   +HPV   Anxiety    situational   Cholecystitis    COVID-19 virus infection 01/01/2020   Heartburn during pregnancy    Pre-eclampsia     Past Surgical History:  Procedure Laterality Date   CESAREAN SECTION N/A 10/15/2013   Procedure: PRIMARY CESAREAN SECTION;  Surgeon: Delice Lesch, MD;  Location: [facility];  Service: Obstetrics;  Laterality: N/A;   FRACTURE SURGERY     left wrist   TENDON REPAIR     right hand   tubes in ears      Family History  Problem Relation Age of Onset   Thyroid disease Mother    Hyperlipidemia Mother    Hypertension Mother    Hypertension Father    Hyperlipidemia Father    Diabetes Paternal Grandmother    Diabetes Paternal Grandfather    Cancer Maternal Grandmother        lung   Hyperlipidemia Maternal Uncle    Hypertension Maternal Uncle    Hyperlipidemia Paternal Aunt    Hypertension Paternal Aunt    Hyperlipidemia Paternal Uncle    Hypertension Paternal Uncle    Heart attack Paternal Uncle    Healthy Son        2     Social History   Tobacco Use   Smoking status: Never   Smokeless tobacco: Never  Vaping Use   Vaping Use: Never used  Substance Use Topics   Alcohol use: No   Drug use: No    Allergies: No Known Allergies  Medications Prior to Admission  Medication Sig Dispense Refill Last Dose   aspirin EC 81 MG tablet Take 81 mg by mouth daily. Swallow whole.   03/18/2022   famotidine (PEPCID) 10 MG tablet Take 10 mg by mouth 2 (two) times daily.   03/18/2022   labetalol (NORMODYNE) 200 MG tablet Take 1 tablet (200 mg total) by mouth 3 (three) times daily. 90 tablet 1 03/18/2022 at 1200   Pediatric Multivit-Minerals (FLINTSTONES COMPLETE) CHEW Chew by mouth.   03/18/2022    Review of Systems  All other systems reviewed and are negative.  Physical Exam   Blood pressure (!) 161/99, pulse 78, temperature 98.4 F (36.9 C), temperature source Oral, resp. rate 16, height 5\' 3"  (1.6 m), weight 104.3 kg,  last menstrual period 07/13/2021, SpO2 98 %.  Physical Exam Vitals and nursing note reviewed. Exam conducted with a chaperone present.  Constitutional:      Appearance: Normal appearance. She is not ill-appearing.  Cardiovascular:     Rate and Rhythm: Normal rate and regular rhythm.     Pulses: Normal pulses.     Heart sounds: Normal heart sounds.  Pulmonary:     Effort: Pulmonary effort is normal.     Breath sounds: Normal breath sounds.  Abdominal:     Comments: Gravid  Skin:    Capillary Refill: Capillary refill takes less than 2 seconds.  Neurological:     Mental Status: She is alert and oriented to person, place, and time.  Psychiatric:        Mood and Affect: Mood normal.        Behavior: Behavior normal.        Thought Content: Thought content normal.        Judgment: Judgment normal.     MAU Course  Procedures  MDM --Severe range x 2 on arrival to MAU. Labetalol Protocol initiated per policy. Dr. Mardelle Matte called, reports given at 1824. Will update Dr. Mardelle Matte if severe  range BPs persist despite IV Labetalol or when labs result. CNM returned to bedside. Update given to patient.  --Dr Mardelle Matte at bedside to discuss next steps with patient  Patient Vitals for the past 24 hrs:  BP Temp Temp src Pulse Resp SpO2 Height Weight  03/18/22 1930 (!) 149/95 -- -- 80 -- 98 % -- --  03/18/22 1925 -- -- -- -- -- 98 % -- --  03/18/22 1920 (!) 151/97 -- -- 78 -- 97 % -- --  03/18/22 1915 -- -- -- -- -- 98 % -- --  03/18/22 1910 (!) 147/95 -- -- 85 -- 98 % -- --  03/18/22 1905 -- -- -- -- -- 98 % -- --  03/18/22 1900 (!) 160/97 -- -- 78 -- 98 % -- --  03/18/22 1855 -- -- -- -- -- 97 % -- --  03/18/22 1850 (!) 156/95 -- -- 71 -- 98 % -- --  03/18/22 1845 -- -- -- -- -- 98 % -- --  03/18/22 1840 (!) 163/97 -- -- 78 -- 98 % -- --  03/18/22 1835 -- -- -- -- -- 98 % -- --  03/18/22 1831 (!) 164/98 -- -- 72 -- -- -- --  03/18/22 1830 -- -- -- -- -- 98 % -- --  03/18/22 1825 -- -- -- -- -- 98 % -- --  03/18/22 1820 -- -- -- -- -- 98 % -- --  03/18/22 1815 (!) 163/96 -- -- 78 -- 98 % -- --  03/18/22 1810 -- -- -- -- -- 97 % -- --  03/18/22 1806 (!) 155/106 -- -- 79 -- -- -- --  03/18/22 1805 -- -- -- -- -- 99 % -- --  03/18/22 1752 (!) 161/99 98.4 F (36.9 C) Oral 78 16 98 % -- --  03/18/22 1748 -- -- -- -- -- -- 5\' 3"  (1.6 m) 104.3 kg   Assessment and Plan  --37 y.o. [id number] at [redacted]w[redacted]d  --Chronic HTN now with SIP w/ SF --Cat I tracing --Per DR. Mardelle Matte, prep for [place], CNM 03/18/2022, 8:46 PM

## 2022-03-18 NOTE — Anesthesia Procedure Notes (Signed)
Spinal  Patient location during procedure: OR Start time: 03/18/2022 8:07 PM End time: 03/18/2022 8:12 PM Reason for block: surgical anesthesia Staffing Performed: anesthesiologist  Anesthesiologist: Josephine Igo, MD Performed by: Josephine Igo, MD Authorized by: Josephine Igo, MD   Preanesthetic Checklist Completed: patient identified, IV checked, site marked, risks and benefits discussed, surgical consent, monitors and equipment checked, pre-op evaluation and timeout performed Spinal Block Patient position: sitting Prep: DuraPrep and site prepped and draped Patient monitoring: heart rate, cardiac monitor, continuous pulse ox and blood pressure Approach: midline Location: L3-4 Injection technique: single-shot Needle Needle type: Pencan  Needle gauge: 24 G Needle length: 9 cm Needle insertion depth: 7 cm Assessment Sensory level: T4 Events: CSF return Additional Notes Patient tolerated procedure well. Adequate sensory level.

## 2022-03-18 NOTE — H&P (Signed)
OB History and Physical   Amber Nguyen is a 37 y.o. female [phone] presenting for elevated blood pressures at [redacted]w[redacted]d.  Patient has been followed for gestational hypertension vs. Chronic hypertension, received BMZ 12/13-12/14 for elevated blood pressures.   She was in office today for routine visit and NST.  JST was reactive with accels, but a subtle decel was noted.  BP was 150/100 in the office and she has had intermittent headaches. Given these findings, she was sent to MAU  While in MAU, she had persistent severe range BP requiring 20 mg, 40 mg, and 80 mg of labetalol.  She currently denies headache, vision changes, RUQ or epigastric pain. She denies LOF, VB, ctx. She reports good FM.   Rh positive. GBS unknown. Panorama low risk boy.    OB History     Gravida  4   Para  1   Term  1   Preterm  0   AB  2   Living  1      SAB  2   IAB  0   Ectopic  0   Multiple  0   Live Births  1          Past Medical History:  Diagnosis Date   Abnormal pap 01/09   +HPV   Anxiety    situational   Cholecystitis    COVID-19 virus infection 01/01/2020   Heartburn during pregnancy    Pre-eclampsia    Past Surgical History:  Procedure Laterality Date   CESAREAN SECTION N/A 10/15/2013   Procedure: PRIMARY CESAREAN SECTION;  Surgeon: Delice Lesch, MD;  Location: [facility];  Service: Obstetrics;  Laterality: N/A;   FRACTURE SURGERY     left wrist   TENDON REPAIR     right hand   tubes in ears     Family History: family history includes Cancer in her maternal grandmother; Diabetes in her paternal grandfather and paternal grandmother; Healthy in her son; Heart attack in her paternal uncle; Hyperlipidemia in her father, maternal uncle, mother, paternal aunt, and paternal uncle; Hypertension in her father, maternal uncle, mother, paternal aunt, and paternal uncle; Thyroid disease in her mother. Social History:  reports that she has never smoked. She has never used smokeless  tobacco. She reports that she does not drink alcohol and does not use drugs.     Maternal Diabetes: No Genetic Screening: Normal Maternal Ultrasounds/Referrals: Normal Fetal Ultrasounds or other Referrals:  None Maternal Substance Abuse:  No Significant Maternal Medications:  Labetalol 200 mg TID Significant Maternal Lab Results:  None Other Comments:  None  Review of Systems - Patient denies fever, chills, SOB, CP, N/V/D.  History   Blood pressure (!) 149/95, pulse 80, temperature 98.4 F (36.9 C), temperature source Oral, resp. rate 16, height 5\' 3"  (1.6 m), weight 104.3 kg, last menstrual period 07/13/2021, SpO2 98 %. Exam Physical Exam   Gen: alert, well appearing, no distress Chest: nonlabored breathing CV: no peripheral edema Abdomen: soft, gravid Ext: no evidence of DVT  Prenatal labs: ABO, Rh: --/--/A POS (01/15 1825) Antibody: NEG (01/15 1825) Rubella:   RPR:    HBsAg:    HIV:    GBS:     Fetal heart tracing moderate variability, pos accels, rare small variable decels  Assessment/Plan: Preeclampsia with severe features (severe range BP).  Only resolution to mild range BP with 80 mg dose of labetalol. Discussed with MFM and is in agreement with delivery. History of prior C section,  elects for repeat. Patient was counseled on the risks of Cesarean delivery, which include but are not limited to bleeding, infection, damage to nearby organs including bowel/bladder/ureter, need for additional procedure or blood transfusion, and implications for future pregnancy.  Patient agreeable to procedure, all questions answered.  Preeclampsia labs: P/C 0.19, HELLP labs wnl Plan for postpartum magnesium Discussed with patient NICU census is currently "red".  Pending staffing issues and necessary care, there is chance that baby would be transferred.  Discussed that given ongoing IV antihypertensive needs, delivery remains indicated when able. Last meal 1:30 PM. Will proceed as  able.   Carlyon Shadow 03/18/2022, 7:45 PM

## 2022-03-18 NOTE — Anesthesia Preprocedure Evaluation (Addendum)
Anesthesia Evaluation  Patient identified by MRN, date of birth, ID band Patient awake    Reviewed: Allergy & Precautions, NPO status , Patient's Chart, lab work & pertinent test results, reviewed documented beta blocker date and time   Airway Mallampati: II  TM Distance: >3 FB Neck ROM: Full    Dental no notable dental hx. (+) Teeth Intact, Dental Advisory Given   Pulmonary neg pulmonary ROS   Pulmonary exam normal breath sounds clear to auscultation       Cardiovascular hypertension, Pt. on medications and Pt. on home beta blockers Normal cardiovascular exam Rhythm:Regular Rate:Normal     Neuro/Psych   Anxiety     negative neurological ROS     GI/Hepatic Neg liver ROS,GERD  Medicated,,  Endo/Other    Morbid obesity  Renal/GU   negative genitourinary   Musculoskeletal negative musculoskeletal ROS (+)    Abdominal  (+) + obese  Peds  Hematology negative hematology ROS (+)   Anesthesia Other Findings   Reproductive/Obstetrics (+) Pregnancy 35 weeks Pre Eclampsia with severe features Hx/o Previous C/Section                             Anesthesia Physical Anesthesia Plan  ASA: 3 and emergent  Anesthesia Plan: Spinal   Post-op Pain Management: Minimal or no pain anticipated   Induction:   PONV Risk Score and Plan: 4 or greater and Treatment may vary due to age or medical condition and Scopolamine patch - Pre-op  Airway Management Planned: Natural Airway  Additional Equipment: None  Intra-op Plan:   Post-operative Plan: Extubation in OR  Informed Consent: I have reviewed the patients History and Physical, chart, labs and discussed the procedure including the risks, benefits and alternatives for the proposed anesthesia with the patient or authorized representative who has indicated his/her understanding and acceptance.     Dental advisory given  Plan Discussed with:  Anesthesiologist and CRNA  Anesthesia Plan Comments:         Anesthesia Quick Evaluation

## 2022-03-18 NOTE — Transfer of Care (Signed)
Immediate Anesthesia Transfer of Care Note  Patient: Amber Nguyen  Procedure(s) Performed: REPEAT CESAREAN SECTION EDC: 04-19-22  ALLERG: NKDA  PREVIOUS X 1  Patient Location: PACU  Anesthesia Type:Spinal  Level of Consciousness: awake, alert , and oriented  Airway & Oxygen Therapy: Patient Spontanous Breathing  Post-op Assessment: Report given to RN and Post -op Vital signs reviewed and stable  Post vital signs: Reviewed and stable  Last Vitals:  Vitals Value Taken Time  BP    Temp    Pulse 79 03/18/22 2133  Resp 17 03/18/22 2133  SpO2 99 % 03/18/22 2133  Vitals shown include unvalidated device data.  Last Pain:  Vitals:   03/18/22 1752  TempSrc: Oral  PainSc:          Complications: No notable events documented.

## 2022-03-18 NOTE — MAU Note (Signed)
Amber Nguyen is a 37 y.o. at [redacted]w[redacted]d here in MAU reporting: was sent over from the office for HTN eval and reports she had decels on her NST. Has been on labetalol throughout her pregnancy. Intermittent headaches and blurry vision but none currently. No RUQ pain. Occasional ctx, no bleeding or LOF. +FM  Onset of complaint: today  Pain score: 0/10  Vitals:   03/18/22 1752  BP: (!) 161/99  Pulse: 78  Resp: 16  Temp: 98.4 F (36.9 C)  SpO2: 98%     FHT:153  Lab orders placed from triage: UA

## 2022-03-18 NOTE — Anesthesia Postprocedure Evaluation (Signed)
Anesthesia Post Note  Patient: Amber Nguyen  Procedure(s) Performed: REPEAT CESAREAN SECTION EDC: 04-19-22  ALLERG: NKDA  PREVIOUS X 1     Patient location during evaluation: PACU Anesthesia Type: Spinal Level of consciousness: oriented and awake and alert Pain management: pain level controlled Vital Signs Assessment: post-procedure vital signs reviewed and stable Respiratory status: spontaneous breathing, respiratory function stable and nonlabored ventilation Cardiovascular status: blood pressure returned to baseline and stable Postop Assessment: no headache, no backache, no apparent nausea or vomiting, spinal receding and patient able to bend at knees Anesthetic complications: no   No notable events documented.  Last Vitals:  Vitals:   03/18/22 2215 03/18/22 2230  BP: 133/79 137/81  Pulse: 67 69  Resp: 13 19  Temp:    SpO2: 97% 96%    Last Pain:  Vitals:   03/18/22 2205  TempSrc: Axillary  PainSc: 0-No pain   Pain Goal:    LLE Motor Response: Non-purposeful movement (03/18/22 2205) LLE Sensation: Increased (03/18/22 2205) RLE Motor Response: Purposeful movement (03/18/22 2205) RLE Sensation: Increased (03/18/22 2205)     Epidural/Spinal Function Cutaneous sensation: Tingles (03/18/22 2205), Patient able to flex knees: Yes (03/18/22 2205), Patient able to lift hips off bed: No (03/18/22 2205), Back pain beyond tenderness at insertion site: No (03/18/22 2205), Progressively worsening motor and/or sensory loss: No (03/18/22 2205), Bowel and/or bladder incontinence post epidural: No (03/18/22 2205)  Debbora Ang A.

## 2022-03-18 NOTE — Op Note (Signed)
Operative Note  PROCEDURE DATE: 03/18/22   PREOPERATIVE DIAGNOSIS: Preeclampsia with severe features, [redacted]w[redacted]d, history of prior C section   POSTOPERATIVE DIAGNOSIS: The same   PROCEDURE:    Repeat Low Transverse Cesarean Section   SURGEON:  Dr. Alpha Gula ASSISTANT: Dr. Naaman Plummer Autry-Lott  An experienced assistant was required given the standard of surgical care given the complexity of the case.  This assistant was needed for exposure, dissection, suctioning, retraction, instrument exchange, assisting with delivery with administration of fundal pressure, and for overall help during the procedure.    INDICATIONS: This is a 37 y.o. yo [id number] at [redacted]w[redacted]d requiring cesarean section secondary to preeclampsia with severe features and history of prior C section. Please see H&P for details.    Decision made to proceed with LTCS. The risks of cesarean section discussed with the patient included but were not limited to: bleeding which may require transfusion or reoperation; infection which may require antibiotics; injury to bowel, bladder, ureters or other surrounding organs; injury to the fetus; need for additional procedures including hysterectomy in the event of a life-threatening hemorrhage; placental abnormalities wth subsequent pregnancies, incisional problems, thromboembolic phenomenon and other postoperative/anesthesia complications. The patient agreed with the proposed plan, giving informed consent for the procedure.     FINDINGS:  Viable female infant in vertex presentation, APGARs 9, 9,  Weight pending, Amniotic fluid clear,  Intact placenta, three vessel cord.  Grossly normal uterus. .   ANESTHESIA:    Epidural ESTIMATED BLOOD LOSS: 276 cc SPECIMENS: Placenta for path (severe preeclampsia) COMPLICATIONS: None immediate    PROCEDURE IN DETAIL:  The patient received intravenous antibiotics (2g Ancef) and had sequential compression devices applied to her lower extremities while in the  preoperative area.  She was then taken to the operating room where epidural anesthesia was dosed up to surgical level and was found to be adequate. She was then placed in a dorsal supine position with a leftward tilt, and prepped and draped in a sterile manner.  A foley catheter was placed into her bladder and attached to constant gravity.  After an adequate timeout was performed, a Pfannenstiel skin incision was made with scalpel and carried through to the underlying layer of fascia. The fascia was incised in the midline and this incision was extended bilaterally using the Mayo scissors. Kocher clamps were applied to the superior aspect of the fascial incision and the underlying rectus muscles were dissected off bluntly. A similar process was carried out on the inferior aspect of the facial incision. The rectus muscles were separated in the midline bluntly and the peritoneum was entered bluntly.  A bladder flap was created sharply and developed bluntly. A transverse hysterotomy was made with a scalpel and extended bilaterally bluntly. The bladder blade was then removed. The infant was successfully delivered, and cord was clamped and cut and infant was handed over to awaiting neonatology team. Uterine massage was then administered and the placenta delivered intact with three-vessel cord. Cord gas for arterial collected. The uterus was cleared of clot and debris.  The hysterotomy was closed with 0 vicryl.  A second imbricating suture of 0-vicryl was used to reinforce the incision and aid in hemostasis.The fascia was closed with 0-PDS in a running fashion from each angle and meeting in the middle with good restoration of anatomy.  The subcutaneus tissue was irrigated and was reapproximated using running plain gut.  The skin was closed with 4-0 Vicryl in a subcuticular fashion.  All surgical site and was hemostatic  at end of procedure without any further bleeding on exam.    Pt tolerated the procedure well. All  sponge/lap/needle counts were correct  X 2. Pt taken to recovery room in stable condition.     Alpha Gula MD

## 2022-03-19 ENCOUNTER — Encounter (HOSPITAL_COMMUNITY): Payer: Self-pay | Admitting: Obstetrics and Gynecology

## 2022-03-19 LAB — CBC
HCT: 32.3 % — ABNORMAL LOW (ref 36.0–46.0)
Hemoglobin: 11.2 g/dL — ABNORMAL LOW (ref 12.0–15.0)
MCH: 32.3 pg (ref 26.0–34.0)
MCHC: 34.7 g/dL (ref 30.0–36.0)
MCV: 93.1 fL (ref 80.0–100.0)
Platelets: 232 10*3/uL (ref 150–400)
RBC: 3.47 MIL/uL — ABNORMAL LOW (ref 3.87–5.11)
RDW: 13.3 % (ref 11.5–15.5)
WBC: 17.9 10*3/uL — ABNORMAL HIGH (ref 4.0–10.5)
nRBC: 0 % (ref 0.0–0.2)

## 2022-03-19 LAB — COMPREHENSIVE METABOLIC PANEL
ALT: 13 U/L (ref 0–44)
AST: 24 U/L (ref 15–41)
Albumin: 2.4 g/dL — ABNORMAL LOW (ref 3.5–5.0)
Alkaline Phosphatase: 107 U/L (ref 38–126)
Anion gap: 11 (ref 5–15)
BUN: 5 mg/dL — ABNORMAL LOW (ref 6–20)
CO2: 20 mmol/L — ABNORMAL LOW (ref 22–32)
Calcium: 8.4 mg/dL — ABNORMAL LOW (ref 8.9–10.3)
Chloride: 100 mmol/L (ref 98–111)
Creatinine, Ser: 0.72 mg/dL (ref 0.44–1.00)
GFR, Estimated: >60 mL/min (ref >60)
Glucose, Bld: 122 mg/dL — ABNORMAL HIGH (ref 70–99)
Potassium: 4.4 mmol/L (ref 3.5–5.1)
Sodium: 131 mmol/L — ABNORMAL LOW (ref 135–145)
Total Bilirubin: 0.3 mg/dL (ref 0.3–1.2)
Total Protein: 5.9 g/dL — ABNORMAL LOW (ref 6.5–8.1)

## 2022-03-19 LAB — RPR: RPR Ser Ql: NONREACTIVE

## 2022-03-19 NOTE — Progress Notes (Signed)
MOB was referred for history of depression/anxiety. * Referral screened out by Clinical Social Worker because none of the following criteria appear to apply: ~ History of anxiety/depression during this pregnancy, or of post-partum depression following prior delivery. ~ Diagnosis of anxiety and/or depression within last 3 years. No concerns noted in OB record. OR * MOB's symptoms currently being treated with medication and/or therapy.  Please contact the Clinical Social Worker if needs arise, by MOB request, or if MOB scores greater than 9/yes to question 10 on Edinburgh Postpartum Depression Screen.  Crystel Demarco Boyd-Gilyard, MSW, LCSW Clinical Social Work [phone]   

## 2022-03-19 NOTE — Progress Notes (Signed)
No C/O, no HA, no vision change  Today's Vitals   03/19/22 0527 03/19/22 0532 03/19/22 0600 03/19/22 0749  BP: (!) 64/44 118/64 113/69 107/62  Pulse: (!) 111 68 73 85  Resp:  18  19  Temp:    97.9 F (36.6 C)  TempSrc:    Oral  SpO2:   95% 95%  Weight:      Height:      PainSc:    0-No pain   Body mass index is 40.72 kg/m.  UO dilute  Abdomen soft, dressing C&D DTR 2+  Results for orders placed or performed during the hospital encounter of 03/18/22 (from the past 24 hour(s))  Protein / creatinine ratio, urine     Status: Abnormal   Collection Time: 03/18/22  5:48 PM  Result Value Ref Range   Creatinine, Urine 84 mg/dL   Total Protein, Urine 16 mg/dL   Protein Creatinine Ratio 0.19 (H) 0.00 - 0.15 mg/mg[Cre]  Comprehensive metabolic panel     Status: Abnormal   Collection Time: 03/18/22  6:11 PM  Result Value Ref Range   Sodium 134 (L) 135 - 145 mmol/L   Potassium 3.8 3.5 - 5.1 mmol/L   Chloride 105 98 - 111 mmol/L   CO2 17 (L) 22 - 32 mmol/L   Glucose, Bld 79 70 - 99 mg/dL   BUN 8 6 - 20 mg/dL   Creatinine, Ser 0.76 0.44 - 1.00 mg/dL   Calcium 9.4 8.9 - 10.3 mg/dL   Total Protein 6.6 6.5 - 8.1 g/dL   Albumin 2.7 (L) 3.5 - 5.0 g/dL   AST 18 15 - 41 U/L   ALT 11 0 - 44 U/L   Alkaline Phosphatase 120 38 - 126 U/L   Total Bilirubin 0.3 0.3 - 1.2 mg/dL   GFR, Estimated >60 >60 mL/min   Anion gap 12 5 - 15  Type and screen [place]     Status: None   Collection Time: 03/18/22  6:25 PM  Result Value Ref Range   ABO/RH(D) A POS    Antibody Screen NEG    Sample Expiration      03/21/2022,2359 Performed at [facility], [address], [place],  [address]   CBC     Status: Abnormal   Collection Time: 03/18/22  6:26 PM  Result Value Ref Range   WBC 11.0 (H) 4.0 - 10.5 K/uL   RBC 3.67 (L) 3.87 - 5.11 MIL/uL   Hemoglobin 11.6 (L) 12.0 - 15.0 g/dL   HCT 34.7 (L) 36.0 - 46.0 %   MCV 94.6 80.0 - 100.0 fL   MCH 31.6 26.0 - 34.0 pg    MCHC 33.4 30.0 - 36.0 g/dL   RDW 13.7 11.5 - 15.5 %   Platelets 236 150 - 400 K/uL   nRBC 0.0 0.0 - 0.2 %  RPR     Status: None   Collection Time: 03/18/22  6:26 PM  Result Value Ref Range   RPR Ser Ql NON REACTIVE NON REACTIVE  Comprehensive metabolic panel     Status: Abnormal   Collection Time: 03/19/22  5:13 AM  Result Value Ref Range   Sodium 131 (L) 135 - 145 mmol/L   Potassium 4.4 3.5 - 5.1 mmol/L   Chloride 100 98 - 111 mmol/L   CO2 20 (L) 22 - 32 mmol/L   Glucose, Bld 122 (H) 70 - 99 mg/dL   BUN 5 (L) 6 - 20 mg/dL  Creatinine, Ser 0.72 0.44 - 1.00 mg/dL   Calcium 8.4 (L) 8.9 - 10.3 mg/dL   Total Protein 5.9 (L) 6.5 - 8.1 g/dL   Albumin 2.4 (L) 3.5 - 5.0 g/dL   AST 24 15 - 41 U/L   ALT 13 0 - 44 U/L   Alkaline Phosphatase 107 38 - 126 U/L   Total Bilirubin 0.3 0.3 - 1.2 mg/dL   GFR, Estimated >60 >60 mL/min   Anion gap 11 5 - 15  CBC     Status: Abnormal   Collection Time: 03/19/22  5:13 AM  Result Value Ref Range   WBC 17.9 (H) 4.0 - 10.5 K/uL   RBC 3.47 (L) 3.87 - 5.11 MIL/uL   Hemoglobin 11.2 (L) 12.0 - 15.0 g/dL   HCT 32.3 (L) 36.0 - 46.0 %   MCV 93.1 80.0 - 100.0 fL   MCH 32.3 26.0 - 34.0 pg   MCHC 34.7 30.0 - 36.0 g/dL   RDW 13.3 11.5 - 15.5 %   Platelets 232 150 - 400 K/uL   nRBC 0.0 0.0 - 0.2 %    Magnesium Sulfate 2 gm/hr running  A/P: Preeclampsia with severe features-delivered         Stop magnesium 2100         BP normal range-will start antihypertensives prn          POD #1 C/S doing well

## 2022-03-19 NOTE — Lactation Note (Signed)
This note was copied from a baby's chart. Lactation Consultation Note  Patient Name: Amber Nguyen [id number] Date: 03/19/2022 Reason for consult: Initial assessment;Late-preterm 34-36.6wks Age:36 hours  P2, [redacted]w[redacted]d PMA.  Visitors in room and mother requested lactation consult later.  Mother was agreeable to [facility] setting up DEBP.  Spoke with RN and mother will call for pump instruction and complete crib card. Mother states she wants to pump and bottle feed.  Reminded mother to feed baby on demand at least q 3 hours.   Maternal Data Does the patient have breastfeeding experience prior to this delivery?: Yes How long did the patient breastfeed?: Attempted to breastfeed but difficulty latching so she pumped and bottle fed for 5 mos.  Feeding Mother's Current Feeding Choice: Breast Milk and Formula Nipple Type: Slow - flow  Lactation Tools Discussed/Used Tools: Pump Breast pump type: Double-Electric Breast Pump  Interventions Interventions: LC Services brochure  Discharge Pump: Personal (Spectra)  Consult Status Consult Status: Follow-up Date: 03/19/22 Follow-up type: In-patient    Vivianne Master [facility] 03/19/2022, 11:22 AM

## 2022-03-20 LAB — COMPREHENSIVE METABOLIC PANEL
ALT: 16 U/L (ref 0–44)
AST: 25 U/L (ref 15–41)
Albumin: 2.4 g/dL — ABNORMAL LOW (ref 3.5–5.0)
Alkaline Phosphatase: 98 U/L (ref 38–126)
Anion gap: 9 (ref 5–15)
BUN: 9 mg/dL (ref 6–20)
CO2: 21 mmol/L — ABNORMAL LOW (ref 22–32)
Calcium: 7.4 mg/dL — ABNORMAL LOW (ref 8.9–10.3)
Chloride: 107 mmol/L (ref 98–111)
Creatinine, Ser: 0.81 mg/dL (ref 0.44–1.00)
GFR, Estimated: >60 mL/min (ref >60)
Glucose, Bld: 72 mg/dL (ref 70–99)
Potassium: 3.5 mmol/L (ref 3.5–5.1)
Sodium: 137 mmol/L (ref 135–145)
Total Bilirubin: 0.2 mg/dL — ABNORMAL LOW (ref 0.3–1.2)
Total Protein: 5.7 g/dL — ABNORMAL LOW (ref 6.5–8.1)

## 2022-03-20 LAB — CBC
HCT: 28.9 % — ABNORMAL LOW (ref 36.0–46.0)
Hemoglobin: 9.7 g/dL — ABNORMAL LOW (ref 12.0–15.0)
MCH: 31.7 pg (ref 26.0–34.0)
MCHC: 33.6 g/dL (ref 30.0–36.0)
MCV: 94.4 fL (ref 80.0–100.0)
Platelets: 192 10*3/uL (ref 150–400)
RBC: 3.06 MIL/uL — ABNORMAL LOW (ref 3.87–5.11)
RDW: 14.3 % (ref 11.5–15.5)
WBC: 10.2 10*3/uL (ref 4.0–10.5)
nRBC: 0 % (ref 0.0–0.2)

## 2022-03-20 MED ORDER — NIFEDIPINE ER OSMOTIC RELEASE 30 MG PO TB24
30.0000 mg | ORAL_TABLET | Freq: Every day | ORAL | Status: DC
  Administered 2022-03-20: 30 mg via ORAL
  Filled 2022-03-20: qty 1

## 2022-03-20 MED ORDER — NIFEDIPINE ER OSMOTIC RELEASE 30 MG PO TB24
30.0000 mg | ORAL_TABLET | Freq: Two times a day (BID) | ORAL | Status: DC
  Administered 2022-03-20 – 2022-03-21 (×2): 30 mg via ORAL
  Filled 2022-03-20 (×2): qty 1

## 2022-03-20 NOTE — Lactation Note (Signed)
This note was copied from a baby's chart. Lactation Consultation Note  Patient Name: Boy Mailen Newborn [id number]'O Date: 03/20/2022 Reason for consult: Follow-up assessment;Late-preterm 34-36.6wks Age:36 hours Mom is choosing to formula feed at this time. Mom stated she is to stressed at this time to BF or pump. If she changes her mind once she gets home she will pump and bottle feed. Mom tearful. FOB stated mom has been crying off and on all day. Explained to mom the important this is to love your baby. You are not a bad mom if you don't BF. Enjoy your baby and try not to stress over BF. Discussed engorgement management. Reminded to call for questions or concerns to OP LC. Maternal Data    Feeding Mother's Current Feeding Choice: Formula Nipple Type: Slow - flow  LATCH Score                    Lactation Tools Discussed/Used    Interventions    Discharge    Consult Status Consult Status: Complete Date: 03/20/22    Theodoro Kalata 03/20/2022, 8:41 PM

## 2022-03-20 NOTE — Progress Notes (Signed)
Postpartum Progress Note  S: No complaints. Feeling well. Feeling more soreness this morning at level of incision. Lochia appropriate. No subjective fevers/chills, Cp, or SOB. Ambulating. No PreE symptoms.  O:     03/20/2022    3:37 AM 03/20/2022   12:06 AM 03/19/2022    9:40 PM  Vitals with BMI  Systolic 245 809 983  Diastolic 71 74 67  Pulse 76 76 80    Gen: NAD, A&O Pulm: NWOB Abd: soft, appropriately ttp, fundus firm and below Umb. Incision c/d/i Ext: No evidence of DVT, trace edema b/l  UOP adequate.   Labs Recent Results (from the past 2160 hour(s))  CBC with Differential/Platelet     Status: Abnormal   Collection Time: 02/13/22  6:50 PM  Result Value Ref Range   WBC 10.4 4.0 - 10.5 K/uL   RBC 3.73 (L) 3.87 - 5.11 MIL/uL   Hemoglobin 11.8 (L) 12.0 - 15.0 g/dL   HCT 35.0 (L) 36.0 - 46.0 %   MCV 93.8 80.0 - 100.0 fL   MCH 31.6 26.0 - 34.0 pg   MCHC 33.7 30.0 - 36.0 g/dL   RDW 12.9 11.5 - 15.5 %   Platelets 272 150 - 400 K/uL   nRBC 0.0 0.0 - 0.2 %   Neutrophils Relative % 73 %   Neutro Abs 7.6 1.7 - 7.7 K/uL   Lymphocytes Relative 21 %   Lymphs Abs 2.1 0.7 - 4.0 K/uL   Monocytes Relative 5 %   Monocytes Absolute 0.6 0.1 - 1.0 K/uL   Eosinophils Relative 1 %   Eosinophils Absolute 0.1 0.0 - 0.5 K/uL   Basophils Relative 0 %   Basophils Absolute 0.0 0.0 - 0.1 K/uL   Immature Granulocytes 0 %   Abs Immature Granulocytes 0.04 0.00 - 0.07 K/uL    Comment: Performed at [facility], [address] [place], [place] [address]  Comprehensive metabolic panel     Status: Abnormal   Collection Time: 02/13/22  6:50 PM  Result Value Ref Range   Sodium 138 135 - 145 mmol/L   Potassium 3.8 3.5 - 5.1 mmol/L   Chloride 107 98 - 111 mmol/L   CO2 20 (L) 22 - 32 mmol/L   Glucose, Bld 91 70 - 99 mg/dL    Comment: Glucose reference range applies only to samples taken after fasting for at least 8 hours.   BUN 7 6 - 20 mg/dL   Creatinine, Ser 0.77 0.44 - 1.00 mg/dL    Calcium 9.0 8.9 - 10.3 mg/dL   Total Protein 6.7 6.5 - 8.1 g/dL   Albumin 2.7 (L) 3.5 - 5.0 g/dL   AST 19 15 - 41 U/L   ALT 15 0 - 44 U/L   Alkaline Phosphatase 107 38 - 126 U/L   Total Bilirubin 0.3 0.3 - 1.2 mg/dL   GFR, Estimated >60 >60 mL/min    Comment: (NOTE) Calculated using the CKD-EPI Creatinine Equation (2021)    Anion gap 11 5 - 15    Comment: Performed at [facility] [address] [place], [place] [address]  Protein / creatinine ratio, urine     Status: Abnormal   Collection Time: 02/13/22  7:06 PM  Result Value Ref Range   Creatinine, Urine 139 mg/dL   Total Protein, Urine 28 mg/dL    Comment: NO NORMAL RANGE ESTABLISHED FOR THIS TEST   Protein Creatinine Ratio 0.20 (H) 0.00 - 0.15 mg/mg[Cre]    Comment: Performed at  [facility], [address] [place], [place] [address]  Urinalysis, Routine w reflex microscopic Urine, Clean Catch     Status: Abnormal   Collection Time: 02/13/22  7:06 PM  Result Value Ref Range   Color, Urine YELLOW YELLOW   APPearance HAZY (A) CLEAR   Specific Gravity, Urine 1.020 1.005 - 1.030   pH 5.0 5.0 - 8.0   Glucose, UA NEGATIVE NEGATIVE mg/dL   Hgb urine dipstick NEGATIVE NEGATIVE   Bilirubin Urine NEGATIVE NEGATIVE   Ketones, ur NEGATIVE NEGATIVE mg/dL   Protein, ur 30 (A) NEGATIVE mg/dL   Nitrite NEGATIVE NEGATIVE   Leukocytes,Ua NEGATIVE NEGATIVE   RBC / HPF 0-5 0 - 5 RBC/hpf   WBC, UA 0-5 0 - 5 WBC/hpf   Bacteria, UA RARE (A) NONE SEEN   Squamous Epithelial / HPF 0-5 0 - 5   Mucus PRESENT     Comment: Performed at [facility], [address], [place], [place] [address]  Type and screen [facility]     Status: None   Collection Time: 02/13/22  7:48 PM  Result Value Ref Range   ABO/RH(D) A POS    Antibody Screen NEG    Sample Expiration      02/16/2022,2359 Performed at [facility], [address] [place], [place] [address]   Protein / creatinine ratio, urine     Status:  Abnormal   Collection Time: 03/18/22  5:48 PM  Result Value Ref Range   Creatinine, Urine 84 mg/dL   Total Protein, Urine 16 mg/dL    Comment: NO NORMAL RANGE ESTABLISHED FOR THIS TEST   Protein Creatinine Ratio 0.19 (H) 0.00 - 0.15 mg/mg[Cre]    Comment: Performed at [facility], [address] [place], [place] [address]  Comprehensive metabolic panel     Status: Abnormal   Collection Time: 03/18/22  6:11 PM  Result Value Ref Range   Sodium 134 (L) 135 - 145 mmol/L   Potassium 3.8 3.5 - 5.1 mmol/L   Chloride 105 98 - 111 mmol/L   CO2 17 (L) 22 - 32 mmol/L   Glucose, Bld 79 70 - 99 mg/dL    Comment: Glucose reference range applies only to samples taken after fasting for at least 8 hours.   BUN 8 6 - 20 mg/dL   Creatinine, Ser 3.87 0.44 - 1.00 mg/dL   Calcium 9.4 8.9 - 56.4 mg/dL   Total Protein 6.6 6.5 - 8.1 g/dL   Albumin 2.7 (L) 3.5 - 5.0 g/dL   AST 18 15 - 41 U/L   ALT 11 0 - 44 U/L   Alkaline Phosphatase 120 38 - 126 U/L   Total Bilirubin 0.3 0.3 - 1.2 mg/dL   GFR, Estimated >33 >29 mL/min    Comment: (NOTE) Calculated using the CKD-EPI Creatinine Equation (2021)    Anion gap 12 5 - 15    Comment: Performed at [facility], [address] [place], [place] [address]  Type and screen [facility]     Status: None   Collection Time: 03/18/22  6:25 PM  Result Value Ref Range   ABO/RH(D) A POS    Antibody Screen NEG    Sample Expiration      03/21/2022,2359 Performed at [facility], [address] [place], [place] [address]   CBC     Status: Abnormal   Collection Time: 03/18/22  6:26 PM  Result  Value Ref Range   WBC 11.0 (H) 4.0 - 10.5 K/uL   RBC 3.67 (L) 3.87 - 5.11 MIL/uL   Hemoglobin 11.6 (L) 12.0 - 15.0 g/dL   HCT 34.7 (L) 36.0 - 46.0 %   MCV 94.6 80.0 - 100.0 fL   MCH 31.6 26.0 - 34.0 pg   MCHC 33.4 30.0 - 36.0 g/dL   RDW 13.7 11.5 - 15.5 %   Platelets 236 150 - 400 K/uL   nRBC 0.0 0.0 - 0.2 %    Comment: Performed at  [facility], [place] [address], [place], [place] [address]  RPR     Status: None   Collection Time: 03/18/22  6:26 PM  Result Value Ref Range   RPR Ser Ql NON REACTIVE NON REACTIVE    Comment: Performed at [facility], [place] [address], [place], [place] [address]  Comprehensive metabolic panel     Status: Abnormal   Collection Time: 03/19/22  5:13 AM  Result Value Ref Range   Sodium 131 (L) 135 - 145 mmol/L   Potassium 4.4 3.5 - 5.1 mmol/L   Chloride 100 98 - 111 mmol/L   CO2 20 (L) 22 - 32 mmol/L   Glucose, Bld 122 (H) 70 - 99 mg/dL    Comment: Glucose reference range applies only to samples taken after fasting for at least 8 hours.   BUN 5 (L) 6 - 20 mg/dL   Creatinine, Ser 0.72 0.44 - 1.00 mg/dL   Calcium 8.4 (L) 8.9 - 10.3 mg/dL   Total Protein 5.9 (L) 6.5 - 8.1 g/dL   Albumin 2.4 (L) 3.5 - 5.0 g/dL   AST 24 15 - 41 U/L   ALT 13 0 - 44 U/L   Alkaline Phosphatase 107 38 - 126 U/L   Total Bilirubin 0.3 0.3 - 1.2 mg/dL   GFR, Estimated >60 >60 mL/min    Comment: (NOTE) Calculated using the CKD-EPI Creatinine Equation (2021)    Anion gap 11 5 - 15    Comment: Performed at [facility], [place] [address], [place], [place] [address]  CBC     Status: Abnormal   Collection Time: 03/19/22  5:13 AM  Result Value Ref Range   WBC 17.9 (H) 4.0 - 10.5 K/uL   RBC 3.47 (L) 3.87 - 5.11 MIL/uL   Hemoglobin 11.2 (L) 12.0 - 15.0 g/dL   HCT 32.3 (L) 36.0 - 46.0 %   MCV 93.1 80.0 - 100.0 fL   MCH 32.3 26.0 - 34.0 pg   MCHC 34.7 30.0 - 36.0 g/dL   RDW 13.3 11.5 - 15.5 %   Platelets 232 150 - 400 K/uL   nRBC 0.0 0.0 - 0.2 %    Comment: Performed at [facility], [place] [address], [place], [place] [address]     A/P:  POD2 s/p rCS, doing well pp. AFVSS. Benign exam. PreE WSF - s/p magnsium, stopped at 2100 yesterday Bps had been normal prior to this morning, labs reassuring (will order repeat for today). Most recent Bps 150s/80s - add on Procardia 30XL and continue to  monitor Bps. For additional prn antihypertensives as indicated, they understand possible need for uptitration of BP medications. Desires circ, will perform when cleared by peds Continue present care.  Hurshel Party, MD

## 2022-03-21 MED ORDER — OXYCODONE HCL 5 MG PO TABS: 5.0000 mg | ORAL_TABLET | ORAL | 0 refills | Status: AC | PRN

## 2022-03-21 MED ORDER — NIFEDIPINE ER 30 MG PO TB24: 30.0000 mg | ORAL_TABLET | Freq: Two times a day (BID) | ORAL | 1 refills | Status: AC

## 2022-03-21 NOTE — Discharge Summary (Signed)
Postpartum Discharge Summary  Date of Service March 21, 2022     Patient Name: Amber Nguyen DOB: December 16, 1985 MRN: [id number]  Date of admission: 03/18/2022 Delivery date:03/18/2022  Delivering provider: Eyvonne Mechanic A  Date of discharge: 03/21/2022  Admitting diagnosis: Hypertension in pregnancy [O16.9] Preeclampsia [O14.90] Intrauterine pregnancy: [redacted]w[redacted]d     Secondary diagnosis:  Principal Problem:   Hypertension in pregnancy Active Problems:   Preeclampsia  Additional problems: severe preeclampsia    Discharge diagnosis: Preterm Pregnancy Delivered and Preeclampsia (severe)                                              Post partum procedures: none Augmentation: N/A Complications: None  Hospital course: Sceduled C/S   37 y.o. yo [id number] at [redacted]w[redacted]d was admitted to the hospital 03/18/2022 for scheduled cesarean section with the following indication:Elective Repeat and severe preeclampsia .Delivery details are as follows:  Membrane Rupture Time/Date: 8:36 PM ,03/18/2022   Delivery Method:C-Section, Low Transverse  Details of operation can be found in separate operative note.  Patient had a postpartum course complicated by hypertension controlled with oral medication.  She is ambulating, tolerating a regular diet, passing flatus, and urinating well. Patient is discharged home in stable condition on  03/21/22        Newborn Data: Birth date:03/18/2022  Birth time:8:37 PM  Gender:Female  Living status:Living  Apgars:9 ,9  Weight:2330 g     Magnesium Sulfate received: Yes: Seizure prophylaxis BMZ received: No Rhophylac:N/A MMR:N/A T-DaP:Given prenatally Flu: N/A Transfusion:No  Physical exam  Vitals:   03/20/22 2035 03/20/22 2347 03/21/22 0458 03/21/22 0804  BP: 134/67 (!) 145/82 (!) 157/81 (!) 149/86  Pulse: 94 77 79 71  Resp: 19 18 16 17   Temp: 98 F (36.7 C)  98.3 F (36.8 C) 98 F (36.7 C)  TempSrc: Oral  Oral Oral  SpO2: 98% 97% 98% 99%  Weight:       Height:       General: alert, cooperative, and no distress Lochia: appropriate Uterine Fundus: firm Incision: Healing well with no significant drainage DVT Evaluation: No evidence of DVT seen on physical exam. Labs: Lab Results  Component Value Date   WBC 10.2 03/20/2022   HGB 9.7 (L) 03/20/2022   HCT 28.9 (L) 03/20/2022   MCV 94.4 03/20/2022   PLT 192 03/20/2022      Latest Ref Rng & Units 03/20/2022    9:19 AM  CMP  Glucose 70 - 99 mg/dL 72   BUN 6 - 20 mg/dL 9   Creatinine 0.44 - 1.00 mg/dL 0.81   Sodium 135 - 145 mmol/L 137   Potassium 3.5 - 5.1 mmol/L 3.5   Chloride 98 - 111 mmol/L 107   CO2 22 - 32 mmol/L 21   Calcium 8.9 - 10.3 mg/dL 7.4   Total Protein 6.5 - 8.1 g/dL 5.7   Total Bilirubin 0.3 - 1.2 mg/dL 0.2   Alkaline Phos 38 - 126 U/L 98   AST 15 - 41 U/L 25   ALT 0 - 44 U/L 16    Edinburgh Score:     No data to display            After visit meds:  Allergies as of 03/21/2022   No Known Allergies      Medication List     STOP taking  these medications    aspirin EC 81 MG tablet   labetalol 200 MG tablet Commonly known as: NORMODYNE       TAKE these medications    famotidine 10 MG tablet Commonly known as: PEPCID Take 10 mg by mouth 2 (two) times daily.   Flintstones Complete Chew Chew by mouth.   NIFEdipine 30 MG 24 hr tablet Commonly known as: ADALAT CC Take 1 tablet (30 mg total) by mouth 2 (two) times daily.   oxyCODONE 5 MG immediate release tablet Commonly known as: Oxy IR/ROXICODONE Take 1-2 tablets (5-10 mg total) by mouth every 4 (four) hours as needed for moderate pain.         Discharge home in stable condition Infant Feeding: Breast Infant Disposition:home with mother Discharge instruction: per After Visit Summary and Postpartum booklet. Activity: Advance as tolerated. Pelvic rest for 6 weeks.  Diet: routine diet Anticipated Birth Control: Unsure Postpartum Appointment:6 weeks Additional Postpartum F/U:  Incision check 1 week Future Appointments:No future appointments. Follow up Visit:      03/21/2022 Cyril Mourning, MD

## 2022-03-21 NOTE — Progress Notes (Signed)
Discharge instructions and prescriptions given to pt. Discussed post c-section care, signs and symptoms to report to the MD, upcoming appointments, and meds. Pt verbalizes understanding and has no questions or concerns at this time. Pt discharged home with baby from hospital in stable condition.

## 2022-03-22 LAB — SURGICAL PATHOLOGY

## 2022-03-27 ENCOUNTER — Telehealth (HOSPITAL_COMMUNITY): Payer: Self-pay | Admitting: *Deleted

## 2022-03-27 NOTE — Telephone Encounter (Signed)
Attempted Hospital Discharge Follow-Up Call.  Left voice mail requesting that patient return RN's phone call if patient has any concerns or questions regarding herself or her baby.  

## 2022-04-12 ENCOUNTER — Inpatient Hospital Stay (HOSPITAL_COMMUNITY): Admit: 2022-04-12 | Payer: BC Managed Care – PPO | Admitting: Obstetrics and Gynecology

## 2023-09-19 ENCOUNTER — Encounter: Payer: Self-pay | Admitting: Advanced Practice Midwife
